# Patient Record
Sex: Female | Born: 1995 | Race: Asian | Hispanic: No | Marital: Single | Smoking: Former smoker
Health system: Southern US, Community
[De-identification: ages and names within clinical notes are randomized; demographics above are authoritative.]

## PROBLEM LIST (undated history)

## (undated) ENCOUNTER — Inpatient Hospital Stay (HOSPITAL_COMMUNITY): Payer: Self-pay

## (undated) DIAGNOSIS — Z789 Other specified health status: Secondary | ICD-10-CM

## (undated) DIAGNOSIS — A749 Chlamydial infection, unspecified: Secondary | ICD-10-CM

## (undated) HISTORY — PX: NO PAST SURGERIES: SHX2092

---
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
## 2015-09-02 ENCOUNTER — Encounter (HOSPITAL_COMMUNITY): Payer: Self-pay | Admitting: *Deleted

## 2015-09-02 ENCOUNTER — Emergency Department (HOSPITAL_COMMUNITY)
Admission: EM | Admit: 2015-09-02 | Discharge: 2015-09-03 | Disposition: A | Discharge status: Home / Self Care | Payer: BLUE CROSS/BLUE SHIELD | Attending: Emergency Medicine | Admitting: Emergency Medicine

## 2015-09-02 ENCOUNTER — Emergency Department (HOSPITAL_COMMUNITY): Payer: BLUE CROSS/BLUE SHIELD

## 2015-09-02 DIAGNOSIS — O2391 Unspecified genitourinary tract infection in pregnancy, first trimester: Secondary | ICD-10-CM | POA: Diagnosis not present

## 2015-09-02 DIAGNOSIS — Z79899 Other long term (current) drug therapy: Secondary | ICD-10-CM | POA: Diagnosis not present

## 2015-09-02 DIAGNOSIS — N760 Acute vaginitis: Secondary | ICD-10-CM

## 2015-09-02 DIAGNOSIS — N939 Abnormal uterine and vaginal bleeding, unspecified: Secondary | ICD-10-CM

## 2015-09-02 DIAGNOSIS — B9689 Other specified bacterial agents as the cause of diseases classified elsewhere: Secondary | ICD-10-CM

## 2015-09-02 DIAGNOSIS — O209 Hemorrhage in early pregnancy, unspecified: Secondary | ICD-10-CM | POA: Diagnosis present

## 2015-09-02 DIAGNOSIS — Z3A01 Less than 8 weeks gestation of pregnancy: Secondary | ICD-10-CM | POA: Insufficient documentation

## 2015-09-02 DIAGNOSIS — O2341 Unspecified infection of urinary tract in pregnancy, first trimester: Secondary | ICD-10-CM | POA: Insufficient documentation

## 2015-09-02 LAB — CBC
HEMATOCRIT: 35.5 % — AB (ref 36.0–46.0)
Hemoglobin: 10.5 g/dL — ABNORMAL LOW (ref 12.0–15.0)
MCH: 20.1 pg — AB (ref 26.0–34.0)
MCHC: 29.6 g/dL — AB (ref 30.0–36.0)
MCV: 67.9 fL — AB (ref 78.0–100.0)
Platelets: 347 10*3/uL (ref 150–400)
RBC: 5.23 MIL/uL — ABNORMAL HIGH (ref 3.87–5.11)
RDW: 16.7 % — AB (ref 11.5–15.5)
WBC: 10.6 10*3/uL — AB (ref 4.0–10.5)

## 2015-09-02 LAB — URINALYSIS, ROUTINE W REFLEX MICROSCOPIC
BILIRUBIN URINE: NEGATIVE
GLUCOSE, UA: NEGATIVE mg/dL
Ketones, ur: NEGATIVE mg/dL
Nitrite: NEGATIVE
PH: 7.5 (ref 5.0–8.0)
Protein, ur: NEGATIVE mg/dL
SPECIFIC GRAVITY, URINE: 1.008 (ref 1.005–1.030)
Urobilinogen, UA: 0.2 mg/dL (ref 0.0–1.0)

## 2015-09-02 LAB — BASIC METABOLIC PANEL
ANION GAP: 7 (ref 5–15)
BUN: 6 mg/dL (ref 6–20)
CALCIUM: 9.2 mg/dL (ref 8.9–10.3)
CO2: 26 mmol/L (ref 22–32)
Chloride: 103 mmol/L (ref 101–111)
Creatinine, Ser: 0.53 mg/dL (ref 0.44–1.00)
GFR calc Af Amer: >60 mL/min (ref >60)
GFR calc non Af Amer: >60 mL/min (ref >60)
GLUCOSE: 100 mg/dL — AB (ref 65–99)
Potassium: 3.6 mmol/L (ref 3.5–5.1)
Sodium: 136 mmol/L (ref 135–145)

## 2015-09-02 LAB — WET PREP, GENITAL
Trich, Wet Prep: NONE SEEN
Yeast Wet Prep HPF POC: NONE SEEN

## 2015-09-02 LAB — URINE MICROSCOPIC-ADD ON

## 2015-09-02 LAB — HCG, QUANTITATIVE, PREGNANCY: HCG, BETA CHAIN, QUANT, S: 10817 m[IU]/mL — AB (ref <5)

## 2015-09-02 LAB — ABO/RH: ABO/RH(D): O POS

## 2015-09-02 LAB — POC URINE PREG, ED: Preg Test, Ur: POSITIVE — AB

## 2015-09-02 MED ORDER — AZITHROMYCIN 250 MG PO TABS
1000.0000 mg | ORAL_TABLET | Freq: Once | ORAL | Status: AC
  Administered 2015-09-02: 1000 mg via ORAL
  Filled 2015-09-02: qty 4

## 2015-09-02 MED ORDER — CEFTRIAXONE SODIUM 250 MG IJ SOLR
250.0000 mg | Freq: Once | INTRAMUSCULAR | Status: AC
  Administered 2015-09-02: 250 mg via INTRAMUSCULAR
  Filled 2015-09-02: qty 250

## 2015-09-02 MED ORDER — LIDOCAINE HCL (PF) 1 % IJ SOLN
2.0000 mL | Freq: Once | INTRAMUSCULAR | Status: AC
  Administered 2015-09-02: 2 mL
  Filled 2015-09-02: qty 5

## 2015-09-02 MED ORDER — METRONIDAZOLE 500 MG PO TABS
500.0000 mg | ORAL_TABLET | Freq: Once | ORAL | Status: AC
  Administered 2015-09-02: 500 mg via ORAL
  Filled 2015-09-02: qty 1

## 2015-09-02 NOTE — ED Notes (Signed)
Pt reports +preg test two weeks ago, having light pink spotting today when she wipes and mild cramping.

## 2015-09-02 NOTE — ED Provider Notes (Signed)
CSN: [id number]     Arrival date & time 09/02/15  1802 History   First MD Initiated Contact with Patient 09/02/15 2142     Chief Complaint  Patient presents with  . Vaginal Bleeding     (Consider location/radiation/quality/duration/timing/severity/associated sxs/prior Treatment) HPI Comments: Patient is an 19 yo F presenting to the ED for one  Episode of vaginal spotting today. She states she has not had any more spotting since earlier today. During triage patient states she was having cramping pain associated with this but denies any pain at any point during my evaluation.  Denies any fevers, chills, nausea, vomiting, abdominal pain , urinary symptoms, vaginal discharge. Patient does not remember when her last menstrual cycle was. No abdominal surgical history. No previous pregnancies. She has not seen an OB/GYN yet for her positive pregnancy test.  Patient is a 19 y.o. female presenting with vaginal bleeding.  Vaginal Bleeding   History reviewed. No pertinent past medical history. History reviewed. No pertinent past surgical history. History reviewed. No pertinent family history. Social History  Substance Use Topics  . Smoking status: Never Smoker   . Smokeless tobacco: None  . Alcohol Use: Yes   OB History    Gravida Para Term Preterm AB TAB SAB Ectopic Multiple Living   1              Review of Systems  Genitourinary: Positive for vaginal bleeding.  All other systems reviewed and are negative.     Allergies  Review of patient's allergies indicates no known allergies.  Home Medications   Prior to Admission medications   Medication Sig Start Date End Date Taking? Authorizing Provider  JUNEL 1/20 1-20 MG-MCG tablet Take 1 tablet by mouth daily. 07/15/15  Yes Historical Provider, MD  metroNIDAZOLE (FLAGYL) 500 MG tablet Take 1 tablet (500 mg total) by mouth 2 (two) times daily. 09/03/15   Princessa Lesmeister, PA-C  nitrofurantoin, macrocrystal-monohydrate, (MACROBID) 100  MG capsule Take 1 capsule (100 mg total) by mouth 2 (two) times daily. 09/03/15   Nanetta Wiegman, PA-C  Prenatal Multivit-Min-Fe-FA (PRE-NATAL FORMULA) TABS Take 1 tablet by mouth daily. 09/03/15   Kainoah Bartosiewicz, PA-C   BP 103/57 mmHg  Pulse 77  Temp(Src) 99 F (37.2 C) (Oral)  Resp 14  Ht 5\' 2"  (1.575 m)  Wt 135 lb (61.236 kg)  BMI 24.69 kg/m2  SpO2 100% Physical Exam  Constitutional: She is oriented to person, place, and time. She appears well-developed and well-nourished. No distress.  HENT:  Head: Normocephalic and atraumatic.  Right Ear: External ear normal.  Left Ear: External ear normal.  Nose: Nose normal.  Mouth/Throat: Oropharynx is clear and moist.  Eyes: Conjunctivae are normal.  Neck: Normal range of motion. Neck supple.  No nuchal rigidity.   Cardiovascular: Normal rate, regular rhythm and normal heart sounds.   Pulmonary/Chest: Effort normal and breath sounds normal.  Abdominal: Soft.  Musculoskeletal: Normal range of motion.  Neurological: She is alert and oriented to person, place, and time.  Skin: Skin is warm and dry. She is not diaphoretic.  Psychiatric: She has a normal mood and affect.  Nursing note and vitals reviewed.    Exam performed by Francee Piccolo L,  exam chaperoned Date: 09/02/2015 Pelvic exam: normal external genitalia without evidence of trauma. VULVA: normal appearing vulva with no masses, tenderness or lesion. VAGINA: normal appearing vagina with normal color and discharge, no lesions. CERVIX: normal appearing cervix without lesions, cervical motion tenderness absent, cervical os closed with  out purulent discharge; vaginal discharge - bloody and yellow, Wet prep and DNA probe for chlamydia and GC obtained.   ADNEXA: normal adnexa in size, nontender and no masses UTERUS: uterus is normal size, shape, consistency and nontender.   ED Course  Procedures (including critical care time) Medications  cefTRIAXone (ROCEPHIN)  injection 250 mg (250 mg Intramuscular Given 09/02/15 2355)  azithromycin (ZITHROMAX) tablet 1,000 mg (1,000 mg Oral Given 09/02/15 2359)  metroNIDAZOLE (FLAGYL) tablet 500 mg (500 mg Oral Given 09/02/15 2359)  lidocaine (PF) (XYLOCAINE) 1 % injection 2 mL (2 mLs Other Given 09/02/15 2355)    Labs Review Labs Reviewed  WET PREP, GENITAL - Abnormal; Notable for the following:    Clue Cells Wet Prep HPF POC FEW (*)    WBC, Wet Prep HPF POC TOO NUMEROUS TO COUNT (*)    All other components within normal limits  URINALYSIS, ROUTINE W REFLEX MICROSCOPIC (NOT AT [facility]) - Abnormal; Notable for the following:    Hgb urine dipstick MODERATE (*)    Leukocytes, UA MODERATE (*)    All other components within normal limits  CBC - Abnormal; Notable for the following:    WBC 10.6 (*)    RBC 5.23 (*)    Hemoglobin 10.5 (*)    HCT 35.5 (*)    MCV 67.9 (*)    MCH 20.1 (*)    MCHC 29.6 (*)    RDW 16.7 (*)    All other components within normal limits  BASIC METABOLIC PANEL - Abnormal; Notable for the following:    Glucose, Bld 100 (*)    All other components within normal limits  HCG, QUANTITATIVE, PREGNANCY - Abnormal; Notable for the following:    hCG, Beta Chain, Quant, S 10817 (*)    All other components within normal limits  POC URINE PREG, ED - Abnormal; Notable for the following:    Preg Test, Ur POSITIVE (*)    All other components within normal limits  URINE CULTURE  URINE MICROSCOPIC-ADD ON  RPR  HIV ANTIBODY (ROUTINE TESTING)  ABO/RH  GC/CHLAMYDIA PROBE AMP ([place]) NOT AT [facility]    Imaging Review US Ob Comp Less 14 Wks  09/02/2015   CLINICAL DATA:  19 year old pregnant female with spotting  EXAM: OBSTETRIC <14 WK [place] AND TRANSVAGINAL OB US  TECHNIQUE: Both transabdominal and transvaginal ultrasound examinations were performed for complete evaluation of the gestation as well as the maternal uterus, adnexal regions, and pelvic cul-de-sac. Transvaginal technique was performed to assess  early pregnancy.  COMPARISON:  None.  FINDINGS: Intrauterine gestational sac: Present  Yolk sac:  Seen  Embryo:  Not visualized  Cardiac Activity: NA  Heart Rate: NA  bpm  MSD: 7  mm   5 w   3  d  There is a small subchorionic hemorrhage.  Maternal uterus/adnexae: There is a 2.8 x 2.2 x 2.1 cm cyst with some or irregular wall in the right ovary. The left ovary is unremarkable.  Small free fluid within the pelvis.  IMPRESSION: Intrauterine gestational sac with an estimated gestational age of [redacted] weeks 3 days. No fetal pole identified at this time. Follow-up recommended.   Electronically Signed   By: Elgie Collard M.D.   On: 09/02/2015 21:48   US Ob Transvaginal  09/02/2015   CLINICAL DATA:  19 year old pregnant female with spotting  EXAM: OBSTETRIC <14 WK [place] AND TRANSVAGINAL OB US  TECHNIQUE: Both transabdominal and transvaginal ultrasound examinations were performed for complete evaluation of the  gestation as well as the maternal uterus, adnexal regions, and pelvic cul-de-sac. Transvaginal technique was performed to assess early pregnancy.  COMPARISON:  None.  FINDINGS: Intrauterine gestational sac: Present  Yolk sac:  Seen  Embryo:  Not visualized  Cardiac Activity: NA  Heart Rate: NA  bpm  MSD: 7  mm   5 w   3  d  There is a small subchorionic hemorrhage.  Maternal uterus/adnexae: There is a 2.8 x 2.2 x 2.1 cm cyst with some or irregular wall in the right ovary. The left ovary is unremarkable.  Small free fluid within the pelvis.  IMPRESSION: Intrauterine gestational sac with an estimated gestational age of [redacted] weeks 3 days. No fetal pole identified at this time. Follow-up recommended.   Electronically Signed   By: Elgie Collard M.D.   On: 09/02/2015 21:48   I have personally reviewed and evaluated these images and lab results as part of my medical decision-making.   EKG Interpretation None      MDM   Final diagnoses:  Vaginal bleeding in pregnancy, first trimester  Bacterial vaginosis  UTI in  pregnancy, first trimester    Filed Vitals:   09/03/15 0034  BP:   Pulse:   Temp: 99 F (37.2 C)  Resp:    Afebrile, NAD, non-toxic appearing, AAOx4.   Patient presenting with an episode of light vaginal bleeding today. Abdomen soft, non-tender, non-distended. Yellow discharge and blood noted in vagina. Cervical os closed. No CMT or adnexal fullness/tenderness to suggest PID. Wet prep with clue cells and moderate WBCs will treat with Rocephin, Azithromycin, and Flagyl. UA with leukocytes, will start on macrobid, culture sent. [place] reviewed. Results reviewed with patient. Discussed the need to follow-up with OB/GYN in 2-3 weeks for repeat ultrasound and hCG draw for further assessment of fetal viability. Return precautions discussed. Patient is agreeable to plan. Patient is stable at time of discharge     Francee Piccolo, PA-C 09/03/15 [id number]  Alvira Monday, MD 09/05/15 1236

## 2015-09-02 NOTE — ED Notes (Signed)
Pelvic Cart at the bedside  

## 2015-09-03 LAB — HIV ANTIBODY (ROUTINE TESTING W REFLEX): HIV SCREEN 4TH GENERATION: NONREACTIVE

## 2015-09-03 LAB — RPR: RPR: NONREACTIVE

## 2015-09-03 LAB — GC/CHLAMYDIA PROBE AMP (~~LOC~~) NOT AT ARMC
Chlamydia: NEGATIVE
Neisseria Gonorrhea: NEGATIVE

## 2015-09-03 MED ORDER — NITROFURANTOIN MONOHYD MACRO 100 MG PO CAPS: 100.0000 mg | ORAL_CAPSULE | Freq: Two times a day (BID) | ORAL | Status: DC

## 2015-09-03 MED ORDER — METRONIDAZOLE 500 MG PO TABS: 500.0000 mg | ORAL_TABLET | Freq: Two times a day (BID) | ORAL | Status: DC

## 2015-09-03 MED ORDER — PRE-NATAL FORMULA PO TABS: 1.0000 | ORAL_TABLET | Freq: Every day | ORAL | Status: DC

## 2015-09-03 NOTE — Discharge Instructions (Signed)
Please follow up with your primary care physician in 1-2 days. If you do not have one please call the [facility] number listed above. Please take your antibiotic until completion. Please follow up with an Ob/Gyn to schedule a follow up appointment for repeat ultrasound and pregnancy test in 2-3 weeks. Please refrain from sexual activity for the next 48 hours. If you receive a call informing you of a positive test result please wait 10 days to allow antibiotics to clear your infection. You must notify all partners within the last six months of any positive test results. Please read all discharge instructions and return precautions.   Vaginal Bleeding During Pregnancy, First Trimester A small amount of bleeding (spotting) from the vagina is relatively common in early pregnancy. It usually stops on its own. Various things may cause bleeding or spotting in early pregnancy. Some bleeding may be related to the pregnancy, and some may not. In most cases, the bleeding is normal and is not a problem. However, bleeding can also be a sign of something serious. Be sure to tell your health care provider about any vaginal bleeding right away. Some possible causes of vaginal bleeding during the first trimester include:  Infection or inflammation of the cervix.  Growths (polyps) on the cervix.  Miscarriage or threatened miscarriage.  Pregnancy tissue has developed outside of the uterus and in a fallopian tube (tubal pregnancy).  Tiny cysts have developed in the uterus instead of pregnancy tissue (molar pregnancy). HOME CARE INSTRUCTIONS  Watch your condition for any changes. The following actions may help to lessen any discomfort you are feeling:  Follow your health care provider's instructions for limiting your activity. If your health care provider orders bed rest, you may need to stay in bed and only get up to use the bathroom. However, your health care provider may allow you to continue  light activity.  If needed, make plans for someone to help with your regular activities and responsibilities while you are on bed rest.  Keep track of the number of pads you use each day, how often you change pads, and how soaked (saturated) they are. Write this down.  Do not use tampons. Do not douche.  Do not have sexual intercourse or orgasms until approved by your health care provider.  If you pass any tissue from your vagina, save the tissue so you can show it to your health care provider.  Only take over-the-counter or prescription medicines as directed by your health care provider.  Do not take aspirin because it can make you bleed.  Keep all follow-up appointments as directed by your health care provider. SEEK MEDICAL CARE IF:  You have any vaginal bleeding during any part of your pregnancy.  You have cramps or labor pains.  You have a fever, not controlled by medicine. SEEK IMMEDIATE MEDICAL CARE IF:   You have severe cramps in your back or belly (abdomen).  You pass large clots or tissue from your vagina.  Your bleeding increases.  You feel light-headed or weak, or you have fainting episodes.  You have chills.  You are leaking fluid or have a gush of fluid from your vagina.  You pass out while having a bowel movement. MAKE SURE YOU:  Understand these instructions.  Will watch your condition.  Will get help right away if you are not doing well or get worse. Document Released: 09/16/2005 Document Revised: 12/12/2013 Document Reviewed: 08/14/2013 [facility] Patient Information 2015 [place], [place]. This information  is not intended to replace advice given to you by your health care provider. Make sure you discuss any questions you have with your health care provider.  Bacterial Vaginosis Bacterial vaginosis is a vaginal infection that occurs when the normal balance of bacteria in the vagina is disrupted. It results from an overgrowth of certain bacteria. This is  the most common vaginal infection in women of childbearing age. Treatment is important to prevent complications, especially in pregnant women, as it can cause a premature delivery. CAUSES  Bacterial vaginosis is caused by an increase in harmful bacteria that are normally present in smaller amounts in the vagina. Several different kinds of bacteria can cause bacterial vaginosis. However, the reason that the condition develops is not fully understood. RISK FACTORS Certain activities or behaviors can put you at an increased risk of developing bacterial vaginosis, including:  Having a new sex partner or multiple sex partners.  Douching.  Using an intrauterine device (IUD) for contraception. Women do not get bacterial vaginosis from toilet seats, bedding, swimming pools, or contact with objects around them. SIGNS AND SYMPTOMS  Some women with bacterial vaginosis have no signs or symptoms. Common symptoms include:  Grey vaginal discharge.  A fishlike odor with discharge, especially after sexual intercourse.  Itching or burning of the vagina and vulva.  Burning or pain with urination. DIAGNOSIS  Your health care provider will take a medical history and examine the vagina for signs of bacterial vaginosis. A sample of vaginal fluid may be taken. Your health care provider will look at this sample under a microscope to check for bacteria and abnormal cells. A vaginal pH test may also be done.  TREATMENT  Bacterial vaginosis may be treated with antibiotic medicines. These may be given in the form of a pill or a vaginal cream. A second round of antibiotics may be prescribed if the condition comes back after treatment.  HOME CARE INSTRUCTIONS   Only take over-the-counter or prescription medicines as directed by your health care provider.  If antibiotic medicine was prescribed, take it as directed. Make sure you finish it even if you start to feel better.  Do not have sex until treatment is  completed.  Tell all sexual partners that you have a vaginal infection. They should see their health care provider and be treated if they have problems, such as a mild rash or itching.  Practice safe sex by using condoms and only having one sex partner. SEEK MEDICAL CARE IF:   Your symptoms are not improving after 3 days of treatment.  You have increased discharge or pain.  You have a fever. MAKE SURE YOU:   Understand these instructions.  Will watch your condition.  Will get help right away if you are not doing well or get worse. FOR MORE INFORMATION  Centers for Disease Control and Prevention, Division of STD Prevention: [employer] American Sexual Health Association (ASHA): www.ashastd.org  Document Released: 12/07/2005 Document Revised: 09/27/2013 Document Reviewed: 07/19/2013 [facility] Patient Information 2015 [place], [place]. This information is not intended to replace advice given to you by your health care provider. Make sure you discuss any questions you have with your health care provider.

## 2015-09-05 LAB — URINE CULTURE

## 2015-09-29 ENCOUNTER — Encounter (HOSPITAL_COMMUNITY): Payer: Self-pay | Admitting: Emergency Medicine

## 2015-09-29 ENCOUNTER — Emergency Department (HOSPITAL_COMMUNITY): Payer: BLUE CROSS/BLUE SHIELD

## 2015-09-29 ENCOUNTER — Emergency Department (HOSPITAL_COMMUNITY)
Admission: EM | Admit: 2015-09-29 | Discharge: 2015-09-29 | Disposition: A | Discharge status: Home / Self Care | Payer: BLUE CROSS/BLUE SHIELD | Attending: Emergency Medicine | Admitting: Emergency Medicine

## 2015-09-29 DIAGNOSIS — N390 Urinary tract infection, site not specified: Secondary | ICD-10-CM

## 2015-09-29 DIAGNOSIS — N9489 Other specified conditions associated with female genital organs and menstrual cycle: Secondary | ICD-10-CM

## 2015-09-29 DIAGNOSIS — O2341 Unspecified infection of urinary tract in pregnancy, first trimester: Secondary | ICD-10-CM | POA: Insufficient documentation

## 2015-09-29 DIAGNOSIS — Z3A09 9 weeks gestation of pregnancy: Secondary | ICD-10-CM | POA: Insufficient documentation

## 2015-09-29 DIAGNOSIS — B373 Candidiasis of vulva and vagina: Secondary | ICD-10-CM | POA: Insufficient documentation

## 2015-09-29 DIAGNOSIS — N9089 Other specified noninflammatory disorders of vulva and perineum: Secondary | ICD-10-CM | POA: Insufficient documentation

## 2015-09-29 DIAGNOSIS — Z79899 Other long term (current) drug therapy: Secondary | ICD-10-CM | POA: Insufficient documentation

## 2015-09-29 DIAGNOSIS — R102 Pelvic and perineal pain: Secondary | ICD-10-CM

## 2015-09-29 DIAGNOSIS — O9989 Other specified diseases and conditions complicating pregnancy, childbirth and the puerperium: Secondary | ICD-10-CM | POA: Diagnosis present

## 2015-09-29 DIAGNOSIS — O98811 Other maternal infectious and parasitic diseases complicating pregnancy, first trimester: Secondary | ICD-10-CM | POA: Insufficient documentation

## 2015-09-29 DIAGNOSIS — B3731 Acute candidiasis of vulva and vagina: Secondary | ICD-10-CM

## 2015-09-29 DIAGNOSIS — R4589 Other symptoms and signs involving emotional state: Secondary | ICD-10-CM

## 2015-09-29 DIAGNOSIS — Z3490 Encounter for supervision of normal pregnancy, unspecified, unspecified trimester: Secondary | ICD-10-CM

## 2015-09-29 LAB — URINE MICROSCOPIC-ADD ON

## 2015-09-29 LAB — CBC WITH DIFFERENTIAL/PLATELET
Basophils Absolute: 0 10*3/uL (ref 0.0–0.1)
Basophils Relative: 0 %
EOS PCT: 0 %
Eosinophils Absolute: 0 10*3/uL (ref 0.0–0.7)
HEMATOCRIT: 33.8 % — AB (ref 36.0–46.0)
Hemoglobin: 10.1 g/dL — ABNORMAL LOW (ref 12.0–15.0)
LYMPHS ABS: 2.3 10*3/uL (ref 0.7–4.0)
Lymphocytes Relative: 18 %
MCH: 20.4 pg — AB (ref 26.0–34.0)
MCHC: 29.9 g/dL — AB (ref 30.0–36.0)
MCV: 68.4 fL — AB (ref 78.0–100.0)
MONO ABS: 1 10*3/uL (ref 0.1–1.0)
MONOS PCT: 8 %
NEUTROS ABS: 9.4 10*3/uL — AB (ref 1.7–7.7)
Neutrophils Relative %: 74 %
PLATELETS: 298 10*3/uL (ref 150–400)
RBC: 4.94 MIL/uL (ref 3.87–5.11)
RDW: 18 % — AB (ref 11.5–15.5)
WBC: 12.7 10*3/uL — AB (ref 4.0–10.5)

## 2015-09-29 LAB — I-STAT BETA HCG BLOOD, ED (MC, WL, AP ONLY): I-stat hCG, quantitative: >2000 m[IU]/mL — ABNORMAL HIGH (ref <5)

## 2015-09-29 LAB — BASIC METABOLIC PANEL
ANION GAP: 10 (ref 5–15)
CALCIUM: 8.8 mg/dL — AB (ref 8.9–10.3)
CO2: 22 mmol/L (ref 22–32)
CREATININE: 0.43 mg/dL — AB (ref 0.44–1.00)
Chloride: 101 mmol/L (ref 101–111)
GFR calc Af Amer: >60 mL/min (ref >60)
GLUCOSE: 84 mg/dL (ref 65–99)
Potassium: 3.2 mmol/L — ABNORMAL LOW (ref 3.5–5.1)
Sodium: 133 mmol/L — ABNORMAL LOW (ref 135–145)

## 2015-09-29 LAB — URINALYSIS, ROUTINE W REFLEX MICROSCOPIC
BILIRUBIN URINE: NEGATIVE
GLUCOSE, UA: NEGATIVE mg/dL
KETONES UR: 15 mg/dL — AB
NITRITE: NEGATIVE
PH: 6 (ref 5.0–8.0)
Protein, ur: 30 mg/dL — AB
Urobilinogen, UA: 0.2 mg/dL (ref 0.0–1.0)

## 2015-09-29 LAB — WET PREP, GENITAL
CLUE CELLS WET PREP: NONE SEEN
Trich, Wet Prep: NONE SEEN
YEAST WET PREP: NONE SEEN

## 2015-09-29 LAB — HCG, QUANTITATIVE, PREGNANCY: hCG, Beta Chain, Quant, S: 83252 m[IU]/mL — ABNORMAL HIGH (ref <5)

## 2015-09-29 MED ORDER — ACETAMINOPHEN 325 MG PO TABS
650.0000 mg | ORAL_TABLET | Freq: Once | ORAL | Status: AC
  Administered 2015-09-29: 650 mg via ORAL
  Filled 2015-09-29: qty 2

## 2015-09-29 MED ORDER — LIDOCAINE VISCOUS 2 % MT SOLN
20.0000 mL | Freq: Once | OROMUCOSAL | Status: AC
  Administered 2015-09-29: 20 mL via OROMUCOSAL
  Filled 2015-09-29: qty 30

## 2015-09-29 MED ORDER — PRENATAL COMPLETE 14-0.4 MG PO TABS: 1.0000 | ORAL_TABLET | Freq: Every day | ORAL | Status: DC

## 2015-09-29 MED ORDER — VALACYCLOVIR HCL 500 MG PO TABS
1000.0000 mg | ORAL_TABLET | Freq: Once | ORAL | Status: AC
  Administered 2015-09-29: 1000 mg via ORAL
  Filled 2015-09-29: qty 2

## 2015-09-29 MED ORDER — CEPHALEXIN 250 MG PO CAPS
500.0000 mg | ORAL_CAPSULE | Freq: Once | ORAL | Status: AC
  Administered 2015-09-29: 500 mg via ORAL
  Filled 2015-09-29: qty 2

## 2015-09-29 MED ORDER — FLUCONAZOLE 100 MG PO TABS
100.0000 mg | ORAL_TABLET | Freq: Every day | ORAL | Status: DC
  Administered 2015-09-29: 100 mg via ORAL
  Filled 2015-09-29: qty 1

## 2015-09-29 MED ORDER — VALACYCLOVIR HCL 1 G PO TABS: 1000.0000 mg | ORAL_TABLET | Freq: Two times a day (BID) | ORAL | Status: DC

## 2015-09-29 MED ORDER — CEPHALEXIN 500 MG PO CAPS: 500.0000 mg | ORAL_CAPSULE | Freq: Four times a day (QID) | ORAL | Status: DC

## 2015-09-29 NOTE — ED Notes (Signed)
Pt reports that she has a yeast infection x 1 month. Pt reports that she was seen her for same and got medication but no relief.

## 2015-09-29 NOTE — ED Provider Notes (Signed)
CSN: [id number]     Arrival date & time 09/29/15  1546 History   First MD Initiated Contact with Patient 09/29/15 1715     Chief Complaint  Patient presents with  . Vaginitis     (Consider location/radiation/quality/duration/timing/severity/associated sxs/prior Treatment) HPI   Blood pressure 117/80, pulse 107, temperature 97.8 F (36.6 C), temperature source Oral, resp. rate 16, height 5\' 2"  (1.575 m), weight 134 lb (60.782 kg), last menstrual period 07/30/2015, SpO2 97 %, unknown if currently breastfeeding.  Hannah Sosa is a 19 y.o. female [redacted] weeks pregnant complaining of swollen vaginal area onset several days ago. Patient states that she "cut herself on her pee hole while she shaving."  Poor historian and slightly evasive, eventually patient admits vaginal discharge in the area since she was seen a month ago. Patient was seen and treated for PID/BV She states that she took her medications fully and to completion but it did not help. States the vaginal swelling and pain started approximately one week ago and that it occurred before she thinks she cut herself shaving. Denies pregnancy, states that she was informed that she was pregnant on last visit 4 weeks ago and cannot explain why she thinks she is no longer pregnant. She has not followed with OB/GYN, has not terminated the pregnancy. States that she does not want to continue with the pregnancy. She denies abdominal pain, vaginal bleeding,  fever, chills. GU symptoms or lesions in sexual partner. On review, of systems she notes a burning with urination.   History reviewed. No pertinent past medical history. History reviewed. No pertinent past surgical history. No family history on file. Social History  Substance Use Topics  . Smoking status: Never Smoker   . Smokeless tobacco: None  . Alcohol Use: Yes   OB History    Gravida Para Term Preterm AB TAB SAB Ectopic Multiple Living   1              Review of Systems  10 systems  reviewed and found to be negative, except as noted in the HPI.   Allergies  Review of patient's allergies indicates no known allergies.  Home Medications   Prior to Admission medications   Medication Sig Start Date End Date Taking? Authorizing Provider  cephALEXin (KEFLEX) 500 MG capsule Take 1 capsule (500 mg total) by mouth 4 (four) times daily. 09/29/15   Caulder Wehner, PA-C  metroNIDAZOLE (FLAGYL) 500 MG tablet Take 1 tablet (500 mg total) by mouth 2 (two) times daily. 09/03/15   Jennifer Piepenbrink, PA-C  nitrofurantoin, macrocrystal-monohydrate, (MACROBID) 100 MG capsule Take 1 capsule (100 mg total) by mouth 2 (two) times daily. 09/03/15   Jennifer Piepenbrink, PA-C  Prenatal Multivit-Min-Fe-FA (PRE-NATAL FORMULA) TABS Take 1 tablet by mouth daily. 09/03/15   Francee Piccolo, PA-C  Prenatal Vit-Fe Fumarate-FA (PRENATAL COMPLETE) 14-0.4 MG TABS Take 1 tablet by mouth daily. 09/29/15   Raul Torrance, PA-C  valACYclovir (VALTREX) 1000 MG tablet Take 1 tablet (1,000 mg total) by mouth 2 (two) times daily. 09/29/15   Cyana Shook, PA-C   BP 113/70 mmHg  Pulse 85  Temp(Src) 98.4 F (36.9 C) (Oral)  Resp 14  Ht 5\' 2"  (1.575 m)  Wt 134 lb (60.782 kg)  BMI 24.50 kg/m2  SpO2 100%  LMP 07/30/2015  Breastfeeding? Unknown Physical Exam  Constitutional: She is oriented to person, place, and time. She appears well-developed and well-nourished. No distress.  HENT:  Head: Normocephalic.  Eyes: Conjunctivae and EOM are normal.  Cardiovascular: Normal rate.   Pulmonary/Chest: Effort normal. No stridor.  Genitourinary:  Pelvic exam is chaperoned by technician: Patient has extremely enlarged and inflamed labia minora. No focal Bartholin's gland abscess.  Patient has thick heterogeneous, cottage cheeselike white yellow discharge with positive cervical motion tenderness.  Musculoskeletal: Normal range of motion.  Neurological: She is alert and oriented to person, place, and time.    Psychiatric: She has a normal mood and affect.  Nursing note and vitals reviewed.       ED Course  Procedures (including critical care time) Labs Review Labs Reviewed  WET PREP, GENITAL - Abnormal; Notable for the following:    WBC, Wet Prep HPF POC TOO NUMEROUS TO COUNT (*)    All other components within normal limits  URINALYSIS, ROUTINE W REFLEX MICROSCOPIC (NOT AT [facility]) - Abnormal; Notable for the following:    APPearance TURBID (*)    Specific Gravity, Urine >1.030 (*)    Hgb urine dipstick MODERATE (*)    Ketones, ur 15 (*)    Protein, ur 30 (*)    Leukocytes, UA MODERATE (*)    All other components within normal limits  URINE MICROSCOPIC-ADD ON - Abnormal; Notable for the following:    Squamous Epithelial / LPF MANY (*)    All other components within normal limits  CBC WITH DIFFERENTIAL/PLATELET - Abnormal; Notable for the following:    WBC 12.7 (*)    Hemoglobin 10.1 (*)    HCT 33.8 (*)    MCV 68.4 (*)    MCH 20.4 (*)    MCHC 29.9 (*)    RDW 18.0 (*)    Neutro Abs 9.4 (*)    All other components within normal limits  BASIC METABOLIC PANEL - Abnormal; Notable for the following:    Sodium 133 (*)    Potassium 3.2 (*)    BUN <5 (*)    Creatinine, Ser 0.43 (*)    Calcium 8.8 (*)    All other components within normal limits  HCG, QUANTITATIVE, PREGNANCY - Abnormal; Notable for the following:    hCG, Beta Chain, Quant, S 29528 (*)    All other components within normal limits  I-STAT BETA HCG BLOOD, ED ([facility], [facility], [facility] ONLY) - Abnormal; Notable for the following:    I-stat hCG, quantitative >2000.0 (*)    All other components within normal limits  URINE CULTURE  GC/CHLAMYDIA PROBE AMP ([place]) NOT AT [facility]    Imaging Review US Ob Comp Less 14 Wks  09/29/2015   CLINICAL DATA:  Pelvic pain during pregnancy. Estimated gestational age by LMP is 12 weeks 2 days. Quantitative beta HCG is 83,252.  EXAM: OBSTETRIC <14 WK ULTRASOUND  TECHNIQUE: Transabdominal  ultrasound was performed for evaluation of the gestation as well as the maternal uterus and adnexal regions.  COMPARISON:  09/02/2015  FINDINGS: Intrauterine gestational sac: A single intrauterine pregnancy is identified.  Yolk sac:  Yolk sac is present.  Embryo:  Fetal pole is present.  Cardiac Activity: Fetal cardiac activity is observed.  Heart Rate: 163 bpm  CRL:   24.4  mm   9 w 1 d                  [place] EDC: 05/02/2016  Maternal uterus/adnexae: Uterus is anteverted. No myometrial mass lesions are identified. There is a small subchorionic hemorrhage. Right ovary is visualized and contains a small cyst measuring 2.8 cm maximally. This is likely functional. Left ovary is not identified. No  abnormal adnexal masses are seen. No free fluid.  IMPRESSION: Single intrauterine pregnancy. Estimated gestational age by crown-rump length is 9 weeks 1 day. Small subchorionic hemorrhage is present.   Electronically Signed   By: Burman Nieves M.D.   On: 09/29/2015 22:20   I have personally reviewed and evaluated these images and lab results as part of my medical decision-making.   EKG Interpretation None      MDM   Final diagnoses:  Denial  Pregnancy  Labial swelling  Vaginal yeast infection  UTI (lower urinary tract infection)    Filed Vitals:   09/29/15 1553 09/29/15 1936  BP: 117/80 113/70  Pulse: 107 85  Temp: 97.8 F (36.6 C) 98.4 F (36.9 C)  TempSrc: Oral Oral  Resp: 16 14  Height:  (1.575 m)   Weight: 134 lb (60.782 kg)   SpO2: 97% 100%    Medications  fluconazole (DIFLUCAN) tablet 100 mg (100 mg Oral Given 09/29/15 2041)  acetaminophen (TYLENOL) tablet 650 mg (650 mg Oral Given 09/29/15 1855)  lidocaine (XYLOCAINE) 2 % viscous mouth solution 20 mL (20 mLs Mouth/Throat Given 09/29/15 1855)  cephALEXin (KEFLEX) capsule 500 mg (500 mg Oral Given 09/29/15 1933)  valACYclovir (VALTREX) tablet 1,000 mg (1,000 mg Oral Given 09/29/15 2042)    Hannah Sosa is a  19 y.o. female [redacted] weeks  pregnant with no prenatal care presenting with significant swelling to the labia minora. Patient also reports dysuria. Pelvic exam with cervical motion tenderness and discharge consistent with yeast infection. Lesions to the mucosa of the labia minora consistent with herpes, will initiate treatment. I think the burning from urination may be from the discomfort however, out of an abundance of caution will treat with Keflex, urine culture pending. Sample is highly contaminated with squamous cells. Urine culture at last visit showed no bacterial predominance. Wet prep shows too numerous to count white blood cells. Patient was treated for PID on last visit 4 weeks ago, states that she was compliant with by mouth treatment however, given this patient's issues I doubt that she is a reliable historian. Like her  OB/GYN consult from Dr. Penne Lash appreciated: She recommends giving acyclovir for presumed genital herpes, think this is likely what is causing the swelling. Also, we have discussed how my clinical exam is consistent with a yeast infection. She states that unfortunately, patient will need Diflucan by mouth which is contraindicated in the first trimester however she thinks that this patient will be able to tolerate intravaginal suppositories. Recommends obtaining basic blood work and pelvic ultrasound, states that this patient does not need a transvaginal ultrasound really transmit transabdominal with just evaluation of fetal heart tones will be sufficient. States that the cervical motion tenderness to be very abnormal in pregnancy to have PID rather that it would be a septic abortion and if the fetus is viable this is highly unlikely.   Reconsulted Dr. Penne Lash after ultrasound shows fetal heart activity and white count of 12.7. She recommends not treating for cervicitis/PID, thinks that this is likely simply from herpes. Recommends counseling this patient to go directly to women's hospital if the pain becomes  too severe or if she cannot urinate.  Advised her to obtain from sex and that her sexual partners will need to be tested and treated.  Discussed at length with patient and she repeated return precautions back to me.  Evaluation does not show pathology that would require ongoing emergent intervention or inpatient treatment. Pt is hemodynamically stable and  mentating appropriately. Discussed findings and plan with patient/guardian, who agrees with care plan. All questions answered. Return precautions discussed and outpatient follow up given.   New Prescriptions   CEPHALEXIN (KEFLEX) 500 MG CAPSULE    Take 1 capsule (500 mg total) by mouth 4 (four) times daily.   PRENATAL VIT-FE FUMARATE-FA (PRENATAL COMPLETE) 14-0.4 MG TABS    Take 1 tablet by mouth daily.   VALACYCLOVIR (VALTREX) 1000 MG TABLET    Take 1 tablet (1,000 mg total) by mouth 2 (two) times daily.      Wynetta Emery, PA-C 09/29/15 1610  Eber Hong, MD 09/30/15 [phone]

## 2015-09-29 NOTE — Discharge Instructions (Signed)
°  Take your antibiotics as directed and to completion. You should never have any leftover antibiotics! Push fluids and stay well hydrated.   Follow with OB/GYN as soon as possible.  If the pain becomes too severe or if you are not able to urinate go directly to women's hospital.  Do NOT take any NSAIDs, such as Aspirin, Motrin, Ibuprofen, Aleve, Naproxen etc. Only take Tylenol for pain. Return to the emergency room  for any severe abdominal pain, increasing vaginal bleeding, passing out or repeated vomiting.   Take acetaminophen (Tylenol) up to 975 mg (this is normally 3 over-the-counter pills) up to 3 times a day. Do not drink alcohol. Make sure your other medications do not contain acetaminophen (Read the labels!)

## 2015-09-30 ENCOUNTER — Ambulatory Visit (INDEPENDENT_AMBULATORY_CARE_PROVIDER_SITE_OTHER): Payer: BLUE CROSS/BLUE SHIELD | Admitting: Obstetrics & Gynecology

## 2015-09-30 ENCOUNTER — Encounter: Payer: Self-pay | Admitting: Obstetrics & Gynecology

## 2015-09-30 VITALS — BP 115/70 | HR 105 | Temp 98.1°F | Ht 62.0 in | Wt 133.0 lb

## 2015-09-30 DIAGNOSIS — N9089 Other specified noninflammatory disorders of vulva and perineum: Secondary | ICD-10-CM | POA: Diagnosis not present

## 2015-09-30 LAB — GC/CHLAMYDIA PROBE AMP (~~LOC~~) NOT AT ARMC
Chlamydia: POSITIVE — AB
Neisseria Gonorrhea: NEGATIVE

## 2015-09-30 MED ORDER — LIDOCAINE HCL 2 % EX GEL: 1.0000 "application " | CUTANEOUS | Status: DC | PRN

## 2015-09-30 NOTE — Addendum Note (Signed)
Addended by: Jaynie Collins A on: 09/30/2015 02:59 PM   Modules accepted: Orders

## 2015-09-30 NOTE — Patient Instructions (Signed)
Return to clinic for any scheduled appointments or for any gynecologic concerns as needed.   

## 2015-09-30 NOTE — Progress Notes (Signed)
CLINIC ENCOUNTER NOTE  History:  19 y.o. G1P0 at [redacted] weeks GA here today for follow up of painful vulvar lesions and swelling evaluated in the ED yesterday; thought to be HSV.  She was given Valtrex for presumptive HSV; but patient wants testing to verify this.  She has not picked up her medication.  History reviewed. No pertinent past medical history.  History reviewed. No pertinent past surgical history.  The following portions of the patient's history were reviewed and updated as appropriate: allergies, current medications, past family history, past medical history, past social history, past surgical history and problem list.   Review of Systems:  Pertinent items noted in HPI and remainder of comprehensive ROS otherwise negative.  Objective:  Physical Exam LMP 07/30/2015 CONSTITUTIONAL: Well-developed, well-nourished female in no acute distress.  HENT:  Normocephalic, atraumatic. External right and left ear normal. Oropharynx is clear and moist EYES: Conjunctivae and EOM are normal. Pupils are equal, round, and reactive to light. No scleral icterus.  NECK: Normal range of motion, supple, no masses SKIN: Skin is warm and dry. No rash noted. Not diaphoretic. No erythema. No pallor. NEUROLGIC: Alert and oriented to person, place, and time. Normal reflexes, muscle tone coordination. No cranial nerve deficit noted. PSYCHIATRIC: Normal mood and affect. Normal behavior. Normal judgment and thought content. CARDIOVASCULAR: Normal heart rate noted RESPIRATORY: Effort and breath sounds normal, no problems with respiration noted ABDOMEN: Soft, no distention noted.   MUSCULOSKELETAL: Normal range of motion. No edema noted. PELVIC: Photo from yesterday is below.  Looks similar today, except with more erythema.  HSV culture obtained.       Labs and Imaging US Ob Comp Less 14 Wks  09/29/2015   CLINICAL DATA:  Pelvic pain during pregnancy. Estimated gestational age by LMP is 12 weeks 2 days.  Quantitative beta HCG is 83,252.  EXAM: OBSTETRIC <14 WK ULTRASOUND  TECHNIQUE: Transabdominal ultrasound was performed for evaluation of the gestation as well as the maternal uterus and adnexal regions.  COMPARISON:  09/02/2015  FINDINGS: Intrauterine gestational sac: A single intrauterine pregnancy is identified.  Yolk sac:  Yolk sac is present.  Embryo:  Fetal pole is present.  Cardiac Activity: Fetal cardiac activity is observed.  Heart Rate: 163 bpm  CRL:   24.4  mm   9 w 1 d                  [place] EDC: 05/02/2016  Maternal uterus/adnexae: Uterus is anteverted. No myometrial mass lesions are identified. There is a small subchorionic hemorrhage. Right ovary is visualized and contains a small cyst measuring 2.8 cm maximally. This is likely functional. Left ovary is not identified. No abnormal adnexal masses are seen. No free fluid.  IMPRESSION: Single intrauterine pregnancy. Estimated gestational age by crown-rump length is 9 weeks 1 day. Small subchorionic hemorrhage is present.   Electronically Signed   By: Burman Nieves M.D.   On: 09/29/2015 22:20   US Ob Comp Less 14 Wks  09/02/2015   CLINICAL DATA:  19 year old pregnant female with spotting  EXAM: OBSTETRIC <14 WK [place] AND TRANSVAGINAL OB US  TECHNIQUE: Both transabdominal and transvaginal ultrasound examinations were performed for complete evaluation of the gestation as well as the maternal uterus, adnexal regions, and pelvic cul-de-sac. Transvaginal technique was performed to assess early pregnancy.  COMPARISON:  None.  FINDINGS: Intrauterine gestational sac: Present  Yolk sac:  Seen  Embryo:  Not visualized  Cardiac Activity: NA  Heart Rate: NA  bpm  MSD: 7  mm   5 w   3  d  There is a small subchorionic hemorrhage.  Maternal uterus/adnexae: There is a 2.8 x 2.2 x 2.1 cm cyst with some or irregular wall in the right ovary. The left ovary is unremarkable.  Small free fluid within the pelvis.  IMPRESSION: Intrauterine gestational sac with an estimated  gestational age of [redacted] weeks 3 days. No fetal pole identified at this time. Follow-up recommended.   Electronically Signed   By: Elgie Collard M.D.   On: 09/02/2015 21:48   US Ob Transvaginal  09/02/2015   CLINICAL DATA:  19 year old pregnant female with spotting  EXAM: OBSTETRIC <14 WK [place] AND TRANSVAGINAL OB US  TECHNIQUE: Both transabdominal and transvaginal ultrasound examinations were performed for complete evaluation of the gestation as well as the maternal uterus, adnexal regions, and pelvic cul-de-sac. Transvaginal technique was performed to assess early pregnancy.  COMPARISON:  None.  FINDINGS: Intrauterine gestational sac: Present  Yolk sac:  Seen  Embryo:  Not visualized  Cardiac Activity: NA  Heart Rate: NA  bpm  MSD: 7  mm   5 w   3  d  There is a small subchorionic hemorrhage.  Maternal uterus/adnexae: There is a 2.8 x 2.2 x 2.1 cm cyst with some or irregular wall in the right ovary. The left ovary is unremarkable.  Small free fluid within the pelvis.  IMPRESSION: Intrauterine gestational sac with an estimated gestational age of [redacted] weeks 3 days. No fetal pole identified at this time. Follow-up recommended.   Electronically Signed   By: Elgie Collard M.D.   On: 09/02/2015 21:48    Assessment & Plan:  1. 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  Patient was told to pick up Valtrex prescription, also told to come to [facility] for worsening symptoms. - Herpes simplex virus culture - HSV(herpes smplx)abs-1+2(IgG+IgM)-bld - RPR - HIV antibody - lidocaine (XYLOCAINE) 2 % jelly; Apply 1 application topically as needed.  Dispense: 30 mL; Refill: 2 Patient plans to follow up [facility] for prenatal care Routine preventative health maintenance measures emphasized. Please refer to After Visit Summary for other counseling recommendations.   Return if symptoms worsen or fail to improve.  Total face-to-face time with patient: 20 minutes. Over 50% of encounter was spent on counseling and coordination of  care.   Jaynie Collins, MD, FACOG Attending Obstetrician & Gynecologist, [facility]

## 2015-10-01 ENCOUNTER — Telehealth (HOSPITAL_BASED_OUTPATIENT_CLINIC_OR_DEPARTMENT_OTHER): Payer: Self-pay | Admitting: Emergency Medicine

## 2015-10-01 LAB — RPR

## 2015-10-01 LAB — URINE CULTURE

## 2015-10-01 LAB — HSV(HERPES SIMPLEX VRS) I + II AB-IGG
HSV 1 Glycoprotein G Ab, IgG: 6.46 IV — ABNORMAL HIGH
HSV 2 Glycoprotein G Ab, IgG: 0.2 IV

## 2015-10-01 LAB — HIV ANTIBODY (ROUTINE TESTING W REFLEX): HIV 1&2 Ab, 4th Generation: NONREACTIVE

## 2015-10-01 NOTE — Telephone Encounter (Signed)
Chart handoff to EDP for treatment plan for chlamydia,HSV pending 

## 2015-10-02 LAB — HSV(HERPES SMPLX)ABS-I+II(IGG+IGM)-BLD
HERPES SIMPLEX VRS I-IGM AB (EIA): 1.06 {index}
HSV 1 GLYCOPROTEIN G AB, IGG: 7.82 IV — AB
HSV 2 GLYCOPROTEIN G AB, IGG: 0.13 IV

## 2015-10-02 LAB — HSV(HERPES SIMPLEX VRS) I + II AB-IGM: Herpes Simplex Vrs I&II-IgM Ab (EIA): 0.81 INDEX

## 2015-10-02 LAB — HERPES SIMPLEX VIRUS CULTURE: Organism ID, Bacteria: NOT DETECTED

## 2015-10-02 NOTE — Progress Notes (Signed)
ED Antimicrobial Stewardship Positive Culture Follow Up   Hannah Sosa is an 19 y.o. female who [facility] with a chief complaint of  Chief Complaint  Patient presents with  . Vaginitis    Recent Results (from the past 720 hour(s))  Urine culture     Status: None   Collection Time: 09/02/15  6:18 PM  Result Value Ref Range Status   Specimen Description URINE, CLEAN CATCH  Final   Special Requests NONE  Final   Culture MULTIPLE SPECIES PRESENT, SUGGEST RECOLLECTION  Final   Report Status 09/05/2015 FINAL  Final  Wet prep, genital     Status: Abnormal   Collection Time: 09/02/15 10:50 PM  Result Value Ref Range Status   Yeast Wet Prep HPF POC NONE SEEN NONE SEEN Final   Trich, Wet Prep NONE SEEN NONE SEEN Final   Clue Cells Wet Prep HPF POC FEW (A) NONE SEEN Final   WBC, Wet Prep HPF POC TOO NUMEROUS TO COUNT (A) NONE SEEN Final  Urine culture     Status: None   Collection Time: 09/29/15  5:43 PM  Result Value Ref Range Status   Specimen Description URINE, RANDOM  Final   Special Requests NONE  Final   Culture   Final    >=100,000 COLONIES/mL DIPHTHEROIDS(CORYNEBACTERIUM SPECIES) Standardized susceptibility testing for this organism is not available.    Report Status 10/01/2015 FINAL  Final  Wet prep, genital     Status: Abnormal   Collection Time: 09/29/15  6:27 PM  Result Value Ref Range Status   Yeast Wet Prep HPF POC NONE SEEN NONE SEEN Final   Trich, Wet Prep NONE SEEN NONE SEEN Final   Clue Cells Wet Prep HPF POC NONE SEEN NONE SEEN Final   WBC, Wet Prep HPF POC TOO NUMEROUS TO COUNT (A) NONE SEEN Final  Herpes simplex virus culture     Status: None (Preliminary result)   Collection Time: 09/30/15  3:55 PM  Result Value Ref Range Status   Preliminary Report Culture has been initiated.  Preliminary    [x]  Treated with Keflex and further treatment not indicated.  ED Provider: Will Marijo Fileansie PA-C  19 y/o F, [redacted] wks pregnant, presents with swollen  vaginal area. Endorses cutting herself shaving and has vaginal discharge. HSV1 possible so genital herpes likely causing symptoms. Was treated with Keflex for UTI. Very contaminated UA grew corynebacterium in culture, which is skin flora and likely contaminant. No further treatment indicated. Pt should follow up with PCP.   Sandi CarneNick Suzette Flagler, PharmD Pharmacy Resident Pager: [phone] 10/02/2015, 8:58 AM Infectious Diseases Pharmacist Phone# [phone]

## 2015-10-03 ENCOUNTER — Telehealth (HOSPITAL_COMMUNITY): Payer: Self-pay

## 2015-10-03 NOTE — Telephone Encounter (Addendum)
Post ED Visit - Positive Culture Follow-up  Culture report reviewed by antimicrobial stewardship pharmacist:  []  Celedonio MiyamotoJeremy Frens, Pharm.D., BCPS []  Georgina PillionElizabeth Martin, Pharm.D., BCPS []  CollinsvilleMinh Pham, [address]Ph[address]D., BCPS, AAHIVP []  Estella HuskMichelle Turner, Pharm.D., BCPS, AAHIVP []  Colgate PalmoliveCristy Reyes, [address]Pharm[address] []  Tennis Mustassie Stewart, Pharm.D. Gary FleetX  Nicholas Gazda, Pharm.D.  Positive Urine Cx, >/= 100,000 colonies -> Diptheroids  Treated with Cephalexin Chart reviewed by Will Dansie PA-C "No treatment"  ALSO  Positive Chlamydia culture  [x]  Patient discharged without antimicrobial prescription and treatment is now indicated  Changes discussed with ED provider: Dr Effie ShyWentz New antibiotic prescription "Azithromycin 1 gram po x 1" DHHS form attached  Contacted patient, date 10/03/2015, time 14:55   [name], [name] Dorn 10/03/2015, 3:01 PM

## 2015-10-04 ENCOUNTER — Telehealth (HOSPITAL_COMMUNITY): Payer: Self-pay

## 2015-10-05 ENCOUNTER — Telehealth (HOSPITAL_BASED_OUTPATIENT_CLINIC_OR_DEPARTMENT_OTHER): Payer: Self-pay | Admitting: Emergency Medicine

## 2015-10-05 NOTE — Telephone Encounter (Signed)
Post ED Visit - Positive Culture Follow-up: Successful Patient Follow-Up  Culture assessed and recommendations reviewed by: []  Celedonio MiyamotoJeremy Frens, Pharm.D., BCPS-AQ ID []  Georgina PillionElizabeth Martin, Pharm.D., BCPS []  RiddleMinh Pham, [address]Pharm[address]D., BCPS, AAHIVP []  Estella HuskMichelle Turner, Pharm.D., BCPS, AAHIVP []  Gurleyristy Reyes, [address]Ph[address] []  Tennis Mustassie Stewart, Pharm.D.  Positive chlamydia culture and +HSV  [x]  Patient discharged without antimicrobial prescription and treatment is now indicated []  Organism is resistant to prescribed ED discharge antimicrobial []  Patient with positive blood cultures  Changes discussed with ED provider: Littie DeedsGentry MD New antibiotic prescription azithromycin 1000mg  po x 1  Called to [facility] [address] Contacted patient, 10/05/15   Berle MullMiller, Kenyah Luba 10/05/2015, 12:28 PM

## 2015-10-15 ENCOUNTER — Emergency Department (HOSPITAL_COMMUNITY): Payer: BLUE CROSS/BLUE SHIELD

## 2015-10-15 ENCOUNTER — Emergency Department (HOSPITAL_COMMUNITY)
Admission: EM | Admit: 2015-10-15 | Discharge: 2015-10-15 | Disposition: A | Discharge status: Home / Self Care | Payer: BLUE CROSS/BLUE SHIELD | Attending: Emergency Medicine | Admitting: Emergency Medicine

## 2015-10-15 DIAGNOSIS — Y92410 Unspecified street and highway as the place of occurrence of the external cause: Secondary | ICD-10-CM | POA: Diagnosis not present

## 2015-10-15 DIAGNOSIS — Z792 Long term (current) use of antibiotics: Secondary | ICD-10-CM | POA: Insufficient documentation

## 2015-10-15 DIAGNOSIS — O9A211 Injury, poisoning and certain other consequences of external causes complicating pregnancy, first trimester: Secondary | ICD-10-CM | POA: Insufficient documentation

## 2015-10-15 DIAGNOSIS — Z3A10 10 weeks gestation of pregnancy: Secondary | ICD-10-CM | POA: Insufficient documentation

## 2015-10-15 DIAGNOSIS — Z79899 Other long term (current) drug therapy: Secondary | ICD-10-CM | POA: Insufficient documentation

## 2015-10-15 DIAGNOSIS — S4992XA Unspecified injury of left shoulder and upper arm, initial encounter: Secondary | ICD-10-CM | POA: Insufficient documentation

## 2015-10-15 DIAGNOSIS — R000 Tachycardia, unspecified: Secondary | ICD-10-CM | POA: Diagnosis not present

## 2015-10-15 DIAGNOSIS — Y9389 Activity, other specified: Secondary | ICD-10-CM | POA: Diagnosis not present

## 2015-10-15 DIAGNOSIS — Y998 Other external cause status: Secondary | ICD-10-CM | POA: Diagnosis not present
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
## 2015-10-15 NOTE — ED Notes (Signed)
Patient undressed, in gown, on continuous pulse oximetry and blood pressure cuff; visitor at bedside 
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
## 2015-10-17 ENCOUNTER — Inpatient Hospital Stay (HOSPITAL_COMMUNITY): Payer: BLUE CROSS/BLUE SHIELD

## 2015-10-17 ENCOUNTER — Encounter (HOSPITAL_COMMUNITY): Payer: Self-pay | Admitting: *Deleted

## 2015-10-17 ENCOUNTER — Inpatient Hospital Stay (HOSPITAL_COMMUNITY)
Admission: AD | Admit: 2015-10-17 | Discharge: 2015-10-17 | Disposition: A | Discharge status: Home / Self Care | Payer: BLUE CROSS/BLUE SHIELD | Source: Ambulatory Visit | Attending: Obstetrics & Gynecology | Admitting: Obstetrics & Gynecology

## 2015-10-17 DIAGNOSIS — Z3491 Encounter for supervision of normal pregnancy, unspecified, first trimester: Secondary | ICD-10-CM

## 2015-10-17 DIAGNOSIS — O98311 Other infections with a predominantly sexual mode of transmission complicating pregnancy, first trimester: Secondary | ICD-10-CM | POA: Insufficient documentation

## 2015-10-17 DIAGNOSIS — O208 Other hemorrhage in early pregnancy: Secondary | ICD-10-CM | POA: Insufficient documentation

## 2015-10-17 DIAGNOSIS — A568 Sexually transmitted chlamydial infection of other sites: Secondary | ICD-10-CM | POA: Insufficient documentation

## 2015-10-17 DIAGNOSIS — Z6790 Unspecified blood type, Rh positive: Secondary | ICD-10-CM

## 2015-10-17 DIAGNOSIS — O468X1 Other antepartum hemorrhage, first trimester: Secondary | ICD-10-CM

## 2015-10-17 DIAGNOSIS — Z3A11 11 weeks gestation of pregnancy: Secondary | ICD-10-CM | POA: Insufficient documentation

## 2015-10-17 DIAGNOSIS — O209 Hemorrhage in early pregnancy, unspecified: Secondary | ICD-10-CM

## 2015-10-17 DIAGNOSIS — Z6791 Unspecified blood type, Rh negative: Secondary | ICD-10-CM | POA: Insufficient documentation

## 2015-10-17 DIAGNOSIS — O418X10 Other specified disorders of amniotic fluid and membranes, first trimester, not applicable or unspecified: Secondary | ICD-10-CM

## 2015-10-17 DIAGNOSIS — O26891 Other specified pregnancy related conditions, first trimester: Secondary | ICD-10-CM | POA: Insufficient documentation

## 2015-10-17 DIAGNOSIS — O98819 Other maternal infectious and parasitic diseases complicating pregnancy, unspecified trimester: Secondary | ICD-10-CM

## 2015-10-17 DIAGNOSIS — A749 Chlamydial infection, unspecified: Secondary | ICD-10-CM

## 2015-10-17 HISTORY — DX: Other specified health status: Z78.9

## 2015-10-17 LAB — CBC
HCT: 33.8 % — ABNORMAL LOW (ref 36.0–46.0)
Hemoglobin: 10.6 g/dL — ABNORMAL LOW (ref 12.0–15.0)
MCH: 21.2 pg — AB (ref 26.0–34.0)
MCHC: 31.4 g/dL (ref 30.0–36.0)
MCV: 67.7 fL — AB (ref 78.0–100.0)
PLATELETS: 318 10*3/uL (ref 150–400)
RBC: 4.99 MIL/uL (ref 3.87–5.11)
RDW: 18.3 % — ABNORMAL HIGH (ref 11.5–15.5)
WBC: 11.7 10*3/uL — ABNORMAL HIGH (ref 4.0–10.5)
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
## 2015-10-17 NOTE — MAU Note (Signed)
Urine sent to lab 
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
## 2015-10-17 NOTE — Discharge Instructions (Signed)
Prenatal Care °WHAT IS PRENATAL CARE?  °Prenatal care is the process of caring for a pregnant woman before she gives birth. Prenatal care makes sure that she and her baby remain as healthy as possible throughout pregnancy. Prenatal care may be provided by a midwife, family practice health care provider, or a childbirth and pregnancy specialist (obstetrician). Prenatal care may include physical examinations, testing, treatments, and education on nutrition, lifestyle, and social support services. °WHY IS PRENATAL CARE SO IMPORTANT?  °Early and consistent prenatal care increases the chance that you and your baby will remain healthy throughout your pregnancy. This type of care also decreases a baby's risk of being born too early (prematurely), or being born smaller than expected (small for gestational age). Any underlying medical conditions you may have that could pose a risk during your pregnancy are discussed during prenatal care visits. You will also be monitored regularly for any new conditions that may arise during your pregnancy so they can be treated quickly and effectively. °WHAT HAPPENS DURING PRENATAL CARE VISITS? °Prenatal care visits may include the following: °Discussion °Tell your health care provider about any new signs or symptoms you have experienced since your last visit. These might include: °· Nausea or vomiting. °· Increased or decreased level of energy. °· Difficulty sleeping. °· Back or leg pain. °· Weight changes. °· Frequent urination. °· Shortness of breath with physical activity. °· Changes in your skin, such as the development of a rash or itchiness. °· Vaginal discharge or bleeding. °· Feelings of excitement or nervousness. °· Changes in your baby's movements. °You may want to write down any questions or topics you want to discuss with your health care provider and bring them with you to your appointment. °Examination °During your first prenatal care visit, you will likely have a complete  physical exam. Your health care provider will often examine your vagina, cervix, and the position of your uterus, as well as check your heart, lungs, and other body systems. As your pregnancy progresses, your health care provider will measure the size of your uterus and your baby's position inside your uterus. He or she may also examine you for early signs of labor. Your prenatal visits may also include checking your blood pressure and, after about 10-12 weeks of pregnancy, listening to your baby's heartbeat. °Testing °Regular testing often includes: °· Urinalysis. This checks your urine for glucose, protein, or signs of infection. °· Blood count. This checks the levels of white and red blood cells in your body. °· Tests for sexually transmitted infections (STIs). Testing for STIs at the beginning of pregnancy is routinely done and is required in many states. °· Antibody testing. You will be checked to see if you are immune to certain illnesses, such as rubella, that can affect a developing fetus. °· Glucose screen. Around 24-28 weeks of pregnancy, your blood glucose level will be checked for signs of gestational diabetes. Follow-up tests may be recommended. °· Group B strep. This is a bacteria that is commonly found inside a woman's vagina. This test will inform your health care provider if you need an antibiotic to reduce the amount of this bacteria in your body prior to labor and childbirth. °· Ultrasound. Many pregnant women undergo an ultrasound screening around 18-20 weeks of pregnancy to evaluate the health of the fetus and check for any developmental abnormalities. °· HIV (human immunodeficiency virus) testing. Early in your pregnancy, you will be screened for HIV. If you are at high risk for HIV, this test   may be repeated during your third trimester of pregnancy. °You may be offered other testing based on your age, personal or family medical history, or other factors.  °HOW OFTEN SHOULD I PLAN TO SEE MY  HEALTH CARE PROVIDER FOR PRENATAL CARE? °Your prenatal care check-up schedule depends on any medical conditions you have before, or develop during, your pregnancy. If you do not have any underlying medical conditions, you will likely be seen for checkups: °· Monthly, during the first 6 months of pregnancy. °· Twice a month during months 7 and 8 of pregnancy. °· Weekly starting in the 9th month of pregnancy and until delivery. °If you develop signs of early labor or other concerning signs or symptoms, you may need to see your health care provider more often. Ask your health care provider what prenatal care schedule is best for you. °WHAT CAN I DO TO KEEP MYSELF AND MY BABY AS HEALTHY AS POSSIBLE DURING MY PREGNANCY? °· Take a prenatal vitamin containing 400 micrograms (0.4 mg) of folic acid every day. Your health care provider may also ask you to take additional vitamins such as iodine, vitamin D, iron, copper, and zinc. °· Take 1500-2000 mg of calcium daily starting at your 20th week of pregnancy until you deliver your baby. °· Make sure you are up to date on your vaccinations. Unless directed otherwise by your health care provider: °¨ You should receive a tetanus, diphtheria, and pertussis (Tdap) vaccination between the 27th and 36th week of your pregnancy, regardless of when your last Tdap immunization occurred. This helps protect your baby from whooping cough (pertussis) after he or she is born. °¨ You should receive an annual inactivated influenza vaccine (IIV) to help protect you and your baby from influenza. This can be done at any point during your pregnancy. °· Eat a well-rounded diet that includes: °¨ Fresh fruits and vegetables. °¨ Lean proteins. °¨ Calcium-rich foods such as milk, yogurt, hard cheeses, and dark, leafy greens. °¨ Whole grain breads. °· Do not eat seafood high in mercury, including: °¨ Swordfish. °¨ Tilefish. °¨ Shark. °¨ King mackerel. °¨ More than 6 oz tuna per week. °· Do not eat: °¨ Raw  or undercooked meats or eggs. °¨ Unpasteurized foods, such as soft cheeses (brie, blue, or feta), juices, and milks. °¨ Lunch meats. °¨ Hot dogs that have not been heated until they are steaming. °· Drink enough water to keep your urine clear or pale yellow. For many women, this may be 10 or more 8 oz glasses of water each day. Keeping yourself hydrated helps deliver nutrients to your baby and may prevent the start of pre-term uterine contractions. °· Do not use any tobacco products including cigarettes, chewing tobacco, or electronic cigarettes. If you need help quitting, ask your health care provider. °· Do not drink beverages containing alcohol. No safe level of alcohol consumption during pregnancy has been determined. °· Do not use any illegal drugs. These can harm your developing baby or cause a miscarriage. °· Ask your health care provider or pharmacist before taking any prescription or over-the-counter medicines, herbs, or supplements. °· Limit your caffeine intake to no more than 200 mg per day. °· Exercise. Unless told otherwise by your health care provider, try to get 30 minutes of moderate exercise most days of the week. Do not  do high-impact activities, contact sports, or activities with a high risk of falling, such as horseback riding or downhill skiing. °· Get plenty of rest. °· Avoid anything that raises your   body temperature, such as hot tubs and saunas.  If you own a cat, do not empty its litter box. Bacteria contained in cat feces can cause an infection called toxoplasmosis. This can result in serious harm to the fetus.  Stay away from chemicals such as insecticides, lead, mercury, and cleaning or paint products that contain solvents.  Do not have any X-rays taken unless medically necessary.  Take a childbirth and breastfeeding preparation class. Ask your health care provider if you need a referral or recommendation.   This information is not intended to replace advice given to you by  your health care provider. Make sure you discuss any questions you have with your health care provider.   Document Released: 12/10/2003 Document Revised: 12/28/2014 Document Reviewed: 02/21/2014 Elsevier Interactive Patient Education 2016 [employer] Subchorionic Hematoma A subchorionic hematoma is a gathering of blood between the outer wall of the placenta and the inner wall of the womb (uterus). The placenta is the organ that connects the fetus to the wall of the uterus. The placenta performs the feeding, breathing (oxygen to the fetus), and waste removal (excretory work) of the fetus.  Subchorionic hematoma is the most common abnormality found on a result from ultrasonography done during the first trimester or early second trimester of pregnancy. If there has been little or no vaginal bleeding, early small hematomas usually shrink on their own and do not affect your baby or pregnancy. The blood is gradually absorbed over 1-2 weeks. When bleeding starts later in pregnancy or the hematoma is larger or occurs in an older pregnant woman, the outcome may not be as good. Larger hematomas may get bigger, which increases the chances for miscarriage. Subchorionic hematoma also increases the risk of premature detachment of the placenta from the uterus, preterm (premature) labor, and stillbirth. HOME CARE INSTRUCTIONS  Stay on bed rest if your health care provider recommends this. Although bed rest will not prevent more bleeding or prevent a miscarriage, your health care provider may recommend bed rest until you are advised otherwise.  Avoid heavy lifting (more than 10 lb [4.5 kg]), exercise, sexual intercourse, or douching as directed by your health care provider.  Keep track of the number of pads you use each day and how soaked (saturated) they are. Write down this information.  Do not use tampons.  Keep all follow-up appointments as directed by your health care provider. Your health care provider may  ask you to have follow-up blood tests or ultrasound tests or both. SEEK IMMEDIATE MEDICAL CARE IF:  You have severe cramps in your stomach, back, abdomen, or pelvis.  You have a fever.  You pass large clots or tissue. Save any tissue for your health care provider to look at.  Your bleeding increases or you become lightheaded, feel weak, or have fainting episodes.   This information is not intended to replace advice given to you by your health care provider. Make sure you discuss any questions you have with your health care provider.   Document Released: 03/24/2007 Document Revised: 12/28/2014 Document Reviewed: 07/06/2013 Elsevier Interactive Patient Education [employer]

## 2015-11-06 ENCOUNTER — Encounter: Payer: BLUE CROSS/BLUE SHIELD | Admitting: Obstetrics & Gynecology

## 2015-11-10 ENCOUNTER — Encounter (HOSPITAL_COMMUNITY): Payer: Self-pay | Admitting: *Deleted

## 2015-11-10 ENCOUNTER — Inpatient Hospital Stay (HOSPITAL_COMMUNITY)
Admission: AD | Admit: 2015-11-10 | Discharge: 2015-11-10 | Disposition: A | Discharge status: Home / Self Care | Payer: BLUE CROSS/BLUE SHIELD | Source: Ambulatory Visit | Attending: Obstetrics & Gynecology | Admitting: Obstetrics & Gynecology

## 2015-11-10 DIAGNOSIS — O210 Mild hyperemesis gravidarum: Secondary | ICD-10-CM | POA: Diagnosis not present

## 2015-11-10 DIAGNOSIS — O9989 Other specified diseases and conditions complicating pregnancy, childbirth and the puerperium: Secondary | ICD-10-CM

## 2015-11-10 DIAGNOSIS — Z3A15 15 weeks gestation of pregnancy: Secondary | ICD-10-CM | POA: Insufficient documentation

## 2015-11-10 DIAGNOSIS — O26892 Other specified pregnancy related conditions, second trimester: Secondary | ICD-10-CM | POA: Insufficient documentation

## 2015-11-10 DIAGNOSIS — R1030 Lower abdominal pain, unspecified: Secondary | ICD-10-CM | POA: Diagnosis present

## 2015-11-10 DIAGNOSIS — R109 Unspecified abdominal pain: Secondary | ICD-10-CM | POA: Diagnosis not present

## 2015-11-10 DIAGNOSIS — K5900 Constipation, unspecified: Secondary | ICD-10-CM | POA: Diagnosis not present

## 2015-11-10 DIAGNOSIS — O26899 Other specified pregnancy related conditions, unspecified trimester: Secondary | ICD-10-CM

## 2015-11-10 LAB — URINALYSIS, ROUTINE W REFLEX MICROSCOPIC
BILIRUBIN URINE: NEGATIVE
GLUCOSE, UA: NEGATIVE mg/dL
Hgb urine dipstick: NEGATIVE
KETONES UR: NEGATIVE mg/dL
Nitrite: NEGATIVE
PROTEIN: NEGATIVE mg/dL
Specific Gravity, Urine: 1.02 (ref 1.005–1.030)
pH: 8 (ref 5.0–8.0)

## 2015-11-10 LAB — URINE MICROSCOPIC-ADD ON

## 2015-11-10 MED ORDER — MECLIZINE HCL 25 MG PO TABS: 25.0000 mg | ORAL_TABLET | Freq: Three times a day (TID) | ORAL | Status: DC | PRN

## 2015-11-10 MED ORDER — DOCUSATE SODIUM 100 MG PO CAPS: 100.0000 mg | ORAL_CAPSULE | Freq: Two times a day (BID) | ORAL | Status: DC

## 2015-11-10 NOTE — Discharge Instructions (Signed)
Safe Medications in Pregnancy  ° °Acne: °Benzoyl Peroxide °Salicylic Acid ° °Backache/Headache: °Tylenol: 2 regular strength every 4 hours OR °             2 Extra strength every 6 hours ° °Colds/Coughs/Allergies: °Benadryl (alcohol free) 25 mg every 6 hours as needed °Breath right strips °Claritin °Cepacol throat lozenges °Chloraseptic throat spray °Cold-Eeze- up to three times per day °Cough drops, alcohol free °Flonase (by prescription only) °Guaifenesin °Mucinex °Robitussin DM (plain only, alcohol free) °Saline nasal spray/drops °Sudafed (pseudoephedrine) & Actifed ** use only after [redacted] weeks gestation and if you do not have high blood pressure °Tylenol °Vicks Vaporub °Zinc lozenges °Zyrtec  ° °Constipation: °Colace °Ducolax suppositories °Fleet enema °Glycerin suppositories °Metamucil °Milk of magnesia °Miralax °Senokot °Smooth move tea ° °Diarrhea: °Kaopectate °Imodium A-D ° °*NO pepto Bismol ° °Hemorrhoids: °Anusol °Anusol HC °Preparation H °Tucks ° °Indigestion: °Tums °Maalox °Mylanta °Zantac  °Pepcid ° °Insomnia: °Benadryl (alcohol free) 25mg every 6 hours as needed °Tylenol PM °Unisom, no Gelcaps ° °Leg Cramps: °Tums °MagGel ° °Nausea/Vomiting:  °Bonine °Dramamine °Emetrol °Ginger extract °Sea bands °Meclizine  °Nausea medication to take during pregnancy:  °Unisom (doxylamine succinate 25 mg tablets) Take one tablet daily at bedtime. If symptoms are not adequately controlled, the dose can be increased to a maximum recommended dose of two tablets daily (1/2 tablet in the morning, 1/2 tablet mid-afternoon and one at bedtime). °Vitamin B6 100mg tablets. Take one tablet twice a day (up to 200 mg per day). ° °Skin Rashes: °Aveeno products °Benadryl cream or 25mg every 6 hours as needed °Calamine Lotion °1% cortisone cream ° °Yeast infection: °Gyne-lotrimin 7 °Monistat 7 ° ° °**If taking multiple medications, please check labels to avoid duplicating the same active ingredients °**take medication as directed on  the label °** Do not exceed 4000 mg of tylenol in 24 hours °**Do not take medications that contain aspirin or ibuprofen ° °  ________________________________________ ° ° ° ° °To schedule your Maternity Eligibility Appointment, please call [phone].  When you arrive for your appointment you must bring the following items or information listed below.  Your appointment will be rescheduled if you do not have these items or are 15 minutes late. °If currently receiving Medicaid, you MUST bring: °1. Medicaid Card °2. Social Security Card °3. Picture ID °4. Proof of Pregnancy °5. Verification of current address if the address on Medicaid card is incorrect "postmarked mail" °If not receiving Medicaid, you MUST bring: °1. Social Security Card °2. Picture ID °3. Birth Certificate (if available) Passport or *Green Card °4. Proof of Pregnancy °5. Verification of current address "postmarked mail" for each income presented. °6. Verification of insurance coverage, if any °7. Check stubs from each employer for the previous month (if unable to present check stub  °for each week, we will accept check stub for the first and last week ill the same month.) If you can't locate check stubs, you must bring a letter from the employer(s) and it must have the following information on letterhead, typed, in English: °o name of company °o company telephone number °o how long been with the company, if less than one month °o how much person earns per hour °o how many hours per week work °o the gross pay the person earned for the previous month °If you are 19 years old or less, you do not have to bring proof of income unless you work or live with the father of the baby and at that time we   will need proof of income from you and/or the father of the baby. °Green Card recipients are eligible for Medicaid for Pregnant Women (MPW) ° ° ° °

## 2015-11-10 NOTE — MAU Provider Note (Signed)
History     CSN: [id number]  Arrival date and time: 11/10/15 1610   None     Chief Complaint  Patient presents with  . Abdominal Pain  . Nausea   HPI Pt is 19 yo Asian G1P0 at [redacted]w[redacted]d pregnant and presents with lower abdominal pain for about 2 weeks. Pt has been constipated and cannot remember when her last normal bowel movement has been. Pt has seen [facility] on 10/27 for one visit with confirmation of viable IUP. Pt was on [employer].  However, pt's mother says she is not going to pay for this pregnancy and pt needs to find alternate care.  Pt has not applied for Medicaid yet.  Pt has been told different options for care and pt is unsure where to go. Pt has been treated for chlamydia with zithromax 1 gm and also UTI with Keflex and HSV with Valtrex. Pt also c/o of nausea- she is not vomiting at this time. Pt denies abdominal pain at this time. Pt denies spotting or bleeding or UTI sx now.  Pt states HSV is better and only a small amount of vaginal discharge now. Pt states partner was treated for chlamydia RN note:  Expand All Collapse All   Pt C/O lower abd cramping x 2 weeks, feeling very nauseated for the last month, hasn't been vomiting. Pt also states she is constipated, last BM 3 days ago. Denies bleeding.       Past Medical History  Diagnosis Date  . Medical history non-contributory     Past Surgical History  Procedure Laterality Date  . No past surgeries      No family history on file.  Social History  Substance Use Topics  . Smoking status: Never Smoker   . Smokeless tobacco: Not on file  . Alcohol Use: Yes    Allergies: No Known Allergies  Prescriptions prior to admission  Medication Sig Dispense Refill Last Dose  . cephALEXin (KEFLEX) 500 MG capsule Take 1 capsule (500 mg total) by mouth 4 (four) times daily. 20 capsule 0 10/14/2015 at Unknown time  . lidocaine (XYLOCAINE) 2 % jelly Apply 1 application topically as needed. 30 mL 2  10/14/2015 at Unknown time  . Prenatal Vit-Fe Fumarate-FA (PRENATAL COMPLETE) 14-0.4 MG TABS Take 1 tablet by mouth daily. 60 each 1 10/14/2015 at Unknown time  . valACYclovir (VALTREX) 1000 MG tablet Take 1 tablet (1,000 mg total) by mouth 2 (two) times daily. 20 tablet 0 10/14/2015 at Unknown time    Review of Systems  Constitutional: Negative for fever and chills.  Gastrointestinal: Positive for nausea, abdominal pain and constipation. Negative for vomiting and diarrhea.  Genitourinary: Negative for dysuria, urgency and frequency.  Neurological: Negative for dizziness.   Physical Exam   Blood pressure 111/65, pulse 112, temperature 97.7 F (36.5 C), temperature source Oral, resp. rate 18, last menstrual period 08/06/2015.  Physical Exam  Nursing note and vitals reviewed. Constitutional: She is oriented to person, place, and time. She appears well-developed and well-nourished. No distress.  HENT:  Head: Normocephalic.  Eyes: Pupils are equal, round, and reactive to light.  Neck: Normal range of motion.  Cardiovascular: Normal rate.   Respiratory: Effort normal. No respiratory distress.  GI: Soft. She exhibits no distension. There is no tenderness. There is no rebound and no guarding.  Musculoskeletal: Normal range of motion.  Neurological: She is alert and oriented to person, place, and time.  Skin: Skin is warm and dry.  Psychiatric: She has  a normal mood and affect.    MAU Course  Procedures Results for orders placed or performed during the hospital encounter of 11/10/15 (from the past 24 hour(s))  Urinalysis, Routine w reflex microscopic ([facility])     Status: Abnormal   Collection Time: 11/10/15  4:34 PM  Result Value Ref Range   Color, Urine YELLOW YELLOW   APPearance CLEAR CLEAR   Specific Gravity, Urine 1.020 1.005 - 1.030   pH 8.0 5.0 - 8.0   Glucose, UA NEGATIVE NEGATIVE mg/dL   Hgb urine dipstick NEGATIVE NEGATIVE   Bilirubin Urine NEGATIVE NEGATIVE    Ketones, ur NEGATIVE NEGATIVE mg/dL   Protein, ur NEGATIVE NEGATIVE mg/dL   Nitrite NEGATIVE NEGATIVE   Leukocytes, UA SMALL (A) NEGATIVE  Urine microscopic-add on     Status: Abnormal   Collection Time: 11/10/15  4:34 PM  Result Value Ref Range   Squamous Epithelial / LPF 0-5 (A) NONE SEEN   WBC, UA 0-5 0 - 5 WBC/hpf   RBC / HPF 0-5 0 - 5 RBC/hpf   Bacteria, UA FEW (A) NONE SEEN  FHR 154 bpm  Assessment and Plan  Abdominal pain in pregnancy- second trimester viable IUP Nausea- Rx Antivert Constipation- Rx Colace [facility] Medicaid application information given to pt  Zohra Clavel 11/10/2015, 4:30 PM

## 2015-11-10 NOTE — MAU Note (Signed)
Pt C/O lower abd cramping x 2 weeks, feeling very nauseated for the last month, hasn't been vomiting.  Pt also states she is constipated, last BM 3 days ago.  Denies bleeding.

## 2015-12-02 ENCOUNTER — Encounter: Payer: Self-pay | Admitting: Student

## 2015-12-02 ENCOUNTER — Ambulatory Visit (INDEPENDENT_AMBULATORY_CARE_PROVIDER_SITE_OTHER): Payer: BLUE CROSS/BLUE SHIELD | Admitting: Student

## 2015-12-02 VITALS — BP 108/60 | HR 88 | Temp 98.8°F | Wt 134.7 lb

## 2015-12-02 DIAGNOSIS — Z23 Encounter for immunization: Secondary | ICD-10-CM | POA: Diagnosis not present

## 2015-12-02 DIAGNOSIS — Z3482 Encounter for supervision of other normal pregnancy, second trimester: Secondary | ICD-10-CM

## 2015-12-02 DIAGNOSIS — Z3490 Encounter for supervision of normal pregnancy, unspecified, unspecified trimester: Secondary | ICD-10-CM

## 2015-12-02 DIAGNOSIS — Z3492 Encounter for supervision of normal pregnancy, unspecified, second trimester: Secondary | ICD-10-CM

## 2015-12-02 LAB — POCT URINALYSIS DIP (DEVICE)
Bilirubin Urine: NEGATIVE
GLUCOSE, UA: NEGATIVE mg/dL
Hgb urine dipstick: NEGATIVE
Ketones, ur: NEGATIVE mg/dL
NITRITE: NEGATIVE
PH: 8.5 — AB (ref 5.0–8.0)
PROTEIN: NEGATIVE mg/dL
Specific Gravity, Urine: 1.015 (ref 1.005–1.030)
UROBILINOGEN UA: 0.2 mg/dL (ref 0.0–1.0)

## 2015-12-02 NOTE — Patient Instructions (Signed)
Second Trimester of Pregnancy The second trimester is from week 13 through week 28, months 4 through 6. The second trimester is often a time when you feel your best. Your body has also adjusted to being pregnant, and you begin to feel better physically. Usually, morning sickness has lessened or quit completely, you may have more energy, and you may have an increase in appetite. The second trimester is also a time when the fetus is growing rapidly. At the end of the sixth month, the fetus is about 9 inches long and weighs about 1 pounds. You will likely begin to feel the baby move (quickening) between 18 and 20 weeks of the pregnancy. BODY CHANGES Your body goes through many changes during pregnancy. The changes vary from woman to woman.   Your weight will continue to increase. You will notice your lower abdomen bulging out.  You may begin to get stretch marks on your hips, abdomen, and breasts.  You may develop headaches that can be relieved by medicines approved by your health care provider.  You may urinate more often because the fetus is pressing on your bladder.  You may develop or continue to have heartburn as a result of your pregnancy.  You may develop constipation because certain hormones are causing the muscles that push waste through your intestines to slow down.  You may develop hemorrhoids or swollen, bulging veins (varicose veins).  You may have back pain because of the weight gain and pregnancy hormones relaxing your joints between the bones in your pelvis and as a result of a shift in weight and the muscles that support your balance.  Your breasts will continue to grow and be tender.  Your gums may bleed and may be sensitive to brushing and flossing.  Dark spots or blotches (chloasma, mask of pregnancy) may develop on your face. This will likely fade after the baby is born.  A dark line from your belly button to the pubic area (linea nigra) may appear. This will likely fade  after the baby is born.  You may have changes in your hair. These can include thickening of your hair, rapid growth, and changes in texture. Some women also have hair loss during or after pregnancy, or hair that feels dry or thin. Your hair will most likely return to normal after your baby is born. WHAT TO EXPECT AT YOUR PRENATAL VISITS During a routine prenatal visit:  You will be weighed to make sure you and the fetus are growing normally.  Your blood pressure will be taken.  Your abdomen will be measured to track your baby's growth.  The fetal heartbeat will be listened to.  Any test results from the previous visit will be discussed. Your health care provider may ask you:  How you are feeling.  If you are feeling the baby move.  If you have had any abnormal symptoms, such as leaking fluid, bleeding, severe headaches, or abdominal cramping.  If you are using any tobacco products, including cigarettes, chewing tobacco, and electronic cigarettes.  If you have any questions. Other tests that may be performed during your second trimester include:  Blood tests that check for:  Low iron levels (anemia).  Gestational diabetes (between 24 and 28 weeks).  Rh antibodies.  Urine tests to check for infections, diabetes, or protein in the urine.  An ultrasound to confirm the proper growth and development of the baby.  An amniocentesis to check for possible genetic problems.  Fetal screens for spina bifida   and Down syndrome.  HIV (human immunodeficiency virus) testing. Routine prenatal testing includes screening for HIV, unless you choose not to have this test. HOME CARE INSTRUCTIONS   Avoid all smoking, herbs, alcohol, and unprescribed drugs. These chemicals affect the formation and growth of the baby.  Do not use any tobacco products, including cigarettes, chewing tobacco, and electronic cigarettes. If you need help quitting, ask your health care provider. You may receive  counseling support and other resources to help you quit.  Follow your health care provider's instructions regarding medicine use. There are medicines that are either safe or unsafe to take during pregnancy.  Exercise only as directed by your health care provider. Experiencing uterine cramps is a good sign to stop exercising.  Continue to eat regular, healthy meals.  Wear a good support bra for breast tenderness.  Do not use hot tubs, steam rooms, or saunas.  Wear your seat belt at all times when driving.  Avoid raw meat, uncooked cheese, cat litter boxes, and soil used by cats. These carry germs that can cause birth defects in the baby.  Take your prenatal vitamins.  Take 1500-2000 mg of calcium daily starting at the 20th week of pregnancy until you deliver your baby.  Try taking a stool softener (if your health care provider approves) if you develop constipation. Eat more high-fiber foods, such as fresh vegetables or fruit and whole grains. Drink plenty of fluids to keep your urine clear or pale yellow.  Take warm sitz baths to soothe any pain or discomfort caused by hemorrhoids. Use hemorrhoid cream if your health care provider approves.  If you develop varicose veins, wear support hose. Elevate your feet for 15 minutes, 3-4 times a day. Limit salt in your diet.  Avoid heavy lifting, wear low heel shoes, and practice good posture.  Rest with your legs elevated if you have leg cramps or low back pain.  Visit your dentist if you have not gone yet during your pregnancy. Use a soft toothbrush to brush your teeth and be gentle when you floss.  A sexual relationship may be continued unless your health care provider directs you otherwise.  Continue to go to all your prenatal visits as directed by your health care provider. SEEK MEDICAL CARE IF:   You have dizziness.  You have mild pelvic cramps, pelvic pressure, or nagging pain in the abdominal area.  You have persistent nausea,  vomiting, or diarrhea.  You have a bad smelling vaginal discharge.  You have pain with urination. SEEK IMMEDIATE MEDICAL CARE IF:   You have a fever.  You are leaking fluid from your vagina.  You have spotting or bleeding from your vagina.  You have severe abdominal cramping or pain.  You have rapid weight gain or loss.  You have shortness of breath with chest pain.  You notice sudden or extreme swelling of your face, hands, ankles, feet, or legs.  You have not felt your baby move in over an hour.  You have severe headaches that do not go away with medicine.  You have vision changes.   This information is not intended to replace advice given to you by your health care provider. Make sure you discuss any questions you have with your health care provider.   Document Released: 12/01/2001 Document Revised: 12/28/2014 Document Reviewed: 02/07/2013 Elsevier Interactive Patient Education 2016 [employer]    Safe Medications in Pregnancy   Acne: Benzoyl Peroxide Salicylic Acid  Backache/Headache: Tylenol: 2 regular strength every 4   hours OR              2 Extra strength every 6 hours  Colds/Coughs/Allergies: Benadryl (alcohol free) 25 mg every 6 hours as needed Breath right strips Claritin Cepacol throat lozenges Chloraseptic throat spray Cold-Eeze- up to three times per day Cough drops, alcohol free Flonase (by prescription only) Guaifenesin Mucinex Robitussin DM (plain only, alcohol free) Saline nasal spray/drops Sudafed (pseudoephedrine) & Actifed ** use only after [redacted] weeks gestation and if you do not have high blood pressure Tylenol Vicks Vaporub Zinc lozenges Zyrtec   Constipation: Colace Ducolax suppositories Fleet enema Glycerin suppositories Metamucil Milk of magnesia Miralax Senokot Smooth move tea  Diarrhea: Kaopectate Imodium A-D  *NO pepto Bismol  Hemorrhoids: Anusol Anusol HC Preparation  H Tucks  Indigestion: Tums Maalox Mylanta Zantac  Pepcid  Insomnia: Benadryl (alcohol free) 25mg every 6 hours as needed Tylenol PM Unisom, no Gelcaps  Leg Cramps: Tums MagGel  Nausea/Vomiting:  Bonine Dramamine Emetrol Ginger extract Sea bands Meclizine  Nausea medication to take during pregnancy:  Unisom (doxylamine succinate 25 mg tablets) Take one tablet daily at bedtime. If symptoms are not adequately controlled, the dose can be increased to a maximum recommended dose of two tablets daily (1/2 tablet in the morning, 1/2 tablet mid-afternoon and one at bedtime). Vitamin B6 100mg tablets. Take one tablet twice a day (up to 200 mg per day).  Skin Rashes: Aveeno products Benadryl cream or 25mg every 6 hours as needed Calamine Lotion 1% cortisone cream  Yeast infection: Gyne-lotrimin 7 Monistat 7   **If taking multiple medications, please check labels to avoid duplicating the same active ingredients **take medication as directed on the label ** Do not exceed 4000 mg of tylenol in 24 hours **Do not take medications that contain aspirin or ibuprofen     

## 2015-12-02 NOTE — Progress Notes (Signed)
   Subjective:    Hannah Sosa is a G1P0 6258w2d being seen today for her first obstetrical visit.  Her obstetrical history is significant for late to care. Patient does intend to breast feed. Pregnancy history fully reviewed.  Patient reports no complaints.  Filed Vitals:   12/02/15 1335  BP: 108/60  Pulse: 88  Temp: 98.8 F (37.1 C)  Weight: 134 lb 11.2 oz (61.1 kg)    HISTORY: OB History  Gravida Para Term Preterm AB SAB TAB Ectopic Multiple Living  1         0    # Outcome Date GA Lbr Len/2nd Weight Sex Delivery Anes PTL Lv  1 Current              Past Medical History  Diagnosis Date  . Medical history non-contributory    Past Surgical History  Procedure Laterality Date  . No past surgeries     History reviewed. No pertinent family history.   Exam  BP 108/60 mmHg  Pulse 88  Temp(Src) 98.8 F (37.1 C)  Wt 134 lb 11.2 oz (61.1 kg)  LMP 08/06/2015   Uterus:   fundal height at umbilicus  Pelvic Exam:    Skin: normal coloration and turgor, no rashes    Neurologic: oriented, normal, normal mood   Extremities: normal strength, tone, and muscle mass, no deformities   HEENT sclera clear, anicteric   Mouth/Teeth mucous membranes moist, pharynx normal without lesions   Neck supple and no masses   Cardiovascular: regular rate and rhythm   Respiratory:  appears well, vitals normal, no respiratory distress, acyanotic, normal RR, ear and throat exam is normal, neck free of mass or lymphadenopathy, chest clear, no wheezing, crepitations, rhonchi, normal symmetric air entry   Abdomen: soft, non-tender; bowel sounds normal; no masses,  no organomegaly      Assessment:    Pregnancy: G1P0 Patient Active Problem List   Diagnosis Date Noted  . Supervision of normal pregnancy in second trimester 12/02/2015        Plan:  1. Pregnancy  - Prenatal Profile - Hemoglobinopathy evaluation - US MFM OB COMP + 14 WK; Future  2. Supervision of normal pregnancy in second  trimester  - US MFM OB COMP + 14 WK; Future - AFP, Quad Screen - GC/Chlamydia Probe Amp - U/A NO MICRO (81003) - Culture, OB Urine    Initial labs drawn. Prenatal vitamins. Problem list reviewed and updated. Genetic Screening discussed Quad Screen: ordered.  Ultrasound discussed; fetal survey: ordered.  Follow up in 4 weeks.    Judeth Hornrin Ernan Runkles 12/02/2015

## 2015-12-03 LAB — PRENATAL PROFILE (SOLSTAS)
ANTIBODY SCREEN: NEGATIVE
Basophils Absolute: 0 10*3/uL (ref 0.0–0.1)
Basophils Relative: 0 % (ref 0–1)
EOS ABS: 0.1 10*3/uL (ref 0.0–0.7)
Eosinophils Relative: 1 % (ref 0–5)
HCT: 32.5 % — ABNORMAL LOW (ref 36.0–46.0)
HIV: NONREACTIVE
Hemoglobin: 10.2 g/dL — ABNORMAL LOW (ref 12.0–15.0)
Hepatitis B Surface Ag: NEGATIVE
LYMPHS ABS: 1.8 10*3/uL (ref 0.7–4.0)
LYMPHS PCT: 15 % (ref 12–46)
MCH: 21.6 pg — AB (ref 26.0–34.0)
MCHC: 31.4 g/dL (ref 30.0–36.0)
MCV: 68.7 fL — ABNORMAL LOW (ref 78.0–100.0)
MONOS PCT: 6 % (ref 3–12)
MPV: 9.7 fL (ref 8.6–12.4)
Monocytes Absolute: 0.7 10*3/uL (ref 0.1–1.0)
NEUTROS PCT: 78 % — AB (ref 43–77)
Neutro Abs: 9.3 10*3/uL — ABNORMAL HIGH (ref 1.7–7.7)
Platelets: 348 10*3/uL (ref 150–400)
RBC: 4.73 MIL/uL (ref 3.87–5.11)
RDW: 18.8 % — AB (ref 11.5–15.5)
RUBELLA: 16.3 {index} — AB (ref <0.90)
Rh Type: POSITIVE
WBC: 11.9 10*3/uL — ABNORMAL HIGH (ref 4.0–10.5)

## 2015-12-04 LAB — HEMOGLOBINOPATHY EVALUATION
HGB S QUANTITAION: 0 %
Hemoglobin Other: 0 %
Hgb A2 Quant: 2.3 % (ref 2.2–3.2)
Hgb A: 97.7 % (ref 96.8–97.8)
Hgb F Quant: 0 % (ref 0.0–2.0)

## 2015-12-05 ENCOUNTER — Other Ambulatory Visit: Payer: Self-pay | Admitting: Student

## 2015-12-05 ENCOUNTER — Ambulatory Visit (HOSPITAL_COMMUNITY)
Admission: RE | Admit: 2015-12-05 | Discharge: 2015-12-05 | Disposition: A | Discharge status: Home / Self Care | Payer: Medicaid Other | Source: Ambulatory Visit | Attending: Student | Admitting: Student

## 2015-12-05 DIAGNOSIS — O35EXX0 Maternal care for other (suspected) fetal abnormality and damage, fetal genitourinary anomalies, not applicable or unspecified: Secondary | ICD-10-CM

## 2015-12-05 DIAGNOSIS — O358XX0 Maternal care for other (suspected) fetal abnormality and damage, not applicable or unspecified: Secondary | ICD-10-CM

## 2015-12-05 DIAGNOSIS — O0932 Supervision of pregnancy with insufficient antenatal care, second trimester: Secondary | ICD-10-CM | POA: Diagnosis not present

## 2015-12-05 DIAGNOSIS — Z3689 Encounter for other specified antenatal screening: Secondary | ICD-10-CM

## 2015-12-05 DIAGNOSIS — Z3A18 18 weeks gestation of pregnancy: Secondary | ICD-10-CM | POA: Insufficient documentation

## 2015-12-05 DIAGNOSIS — Z36 Encounter for antenatal screening of mother: Secondary | ICD-10-CM | POA: Insufficient documentation

## 2015-12-05 LAB — AFP, QUAD SCREEN
AFP: 88.6 ng/mL
CURR GEST AGE: 18.2 wks.days
HCG TOTAL: 11.71 [IU]/mL
INH: 81.7 pg/mL
Interpretation-AFP: NEGATIVE
MOM FOR HCG: 0.33
MoM for AFP: 1.61
MoM for INH: 0.45
Open Spina bifida: NEGATIVE
Osb Risk: 1:2040 {titer}
Tri 18 Scr Risk Est: NEGATIVE
UE3 MOM: 0.63
UE3 VALUE: 1.07 ng/mL

## 2015-12-05 LAB — CULTURE, OB URINE: Colony Count: 4000

## 2015-12-09 ENCOUNTER — Telehealth: Payer: Self-pay | Admitting: *Deleted

## 2015-12-09 ENCOUNTER — Encounter: Payer: Self-pay | Admitting: Obstetrics and Gynecology

## 2015-12-09 ENCOUNTER — Other Ambulatory Visit: Payer: Self-pay | Admitting: Obstetrics and Gynecology

## 2015-12-09 DIAGNOSIS — R8271 Bacteriuria: Secondary | ICD-10-CM

## 2015-12-09 MED ORDER — PENICILLIN V POTASSIUM 500 MG PO TABS: 500.0000 mg | ORAL_TABLET | Freq: Four times a day (QID) | ORAL | Status: DC

## 2015-12-09 NOTE — Telephone Encounter (Signed)
Per Dr. Jolayne Pantheronstant need to call patient and tell her +GBS UTI and RX prescribed.

## 2015-12-09 NOTE — Telephone Encounter (Signed)
Called Tifanie - unable to leave a message- heard a message voicemail not set up.

## 2015-12-11 NOTE — Telephone Encounter (Signed)
Called patient and informed her of results and prescription at pharmacy. Patient verbalized understanding & had no questions

## 2015-12-24 ENCOUNTER — Telehealth: Payer: Self-pay | Admitting: General Practice

## 2015-12-24 NOTE — Telephone Encounter (Signed)
Patient called into front office stating she is having a sharp pain on her lower right side that is constant. Patient denies taking anything and states she has not finished her medication for UTI. Discussed with patient to finish antibiotic and take as directed until she finishes the medication. Also discussed taking two extra strength tylenol to see if pain improves with that. Patient verbalized understanding & had no other questions

## 2015-12-26 ENCOUNTER — Encounter (HOSPITAL_COMMUNITY): Payer: Self-pay | Admitting: *Deleted

## 2015-12-26 ENCOUNTER — Inpatient Hospital Stay (HOSPITAL_COMMUNITY)
Admission: AD | Admit: 2015-12-26 | Discharge: 2015-12-27 | DRG: 775 | Disposition: A | Discharge status: Home / Self Care | Payer: Medicaid Other | Source: Ambulatory Visit | Attending: Family Medicine | Admitting: Family Medicine

## 2015-12-26 DIAGNOSIS — O42912 Preterm premature rupture of membranes, unspecified as to length of time between rupture and onset of labor, second trimester: Principal | ICD-10-CM | POA: Diagnosis present

## 2015-12-26 DIAGNOSIS — Z3A21 21 weeks gestation of pregnancy: Secondary | ICD-10-CM

## 2015-12-26 DIAGNOSIS — R8271 Bacteriuria: Secondary | ICD-10-CM

## 2015-12-26 DIAGNOSIS — Z3492 Encounter for supervision of normal pregnancy, unspecified, second trimester: Secondary | ICD-10-CM

## 2015-12-26 LAB — URINALYSIS, ROUTINE W REFLEX MICROSCOPIC
Bilirubin Urine: NEGATIVE
GLUCOSE, UA: NEGATIVE mg/dL
KETONES UR: NEGATIVE mg/dL
Nitrite: NEGATIVE
PROTEIN: 100 mg/dL — AB
Specific Gravity, Urine: >1.03 — ABNORMAL HIGH (ref 1.005–1.030)
pH: 6 (ref 5.0–8.0)

## 2015-12-26 LAB — CBC
HEMATOCRIT: 31.5 % — AB (ref 36.0–46.0)
Hemoglobin: 9.7 g/dL — ABNORMAL LOW (ref 12.0–15.0)
MCH: 21.6 pg — AB (ref 26.0–34.0)
MCHC: 30.8 g/dL (ref 30.0–36.0)
MCV: 70 fL — AB (ref 78.0–100.0)
PLATELETS: 325 10*3/uL (ref 150–400)
RBC: 4.5 MIL/uL (ref 3.87–5.11)
RDW: 18.5 % — ABNORMAL HIGH (ref 11.5–15.5)
WBC: 16 10*3/uL — ABNORMAL HIGH (ref 4.0–10.5)

## 2015-12-26 LAB — TYPE AND SCREEN
ABO/RH(D): O POS
Antibody Screen: NEGATIVE

## 2015-12-26 LAB — WET PREP, GENITAL
CLUE CELLS WET PREP: NONE SEEN
Sperm: NONE SEEN
Trich, Wet Prep: NONE SEEN
Yeast Wet Prep HPF POC: NONE SEEN

## 2015-12-26 LAB — URINE MICROSCOPIC-ADD ON

## 2015-12-26 MED ORDER — DIPHENHYDRAMINE HCL 50 MG/ML IJ SOLN: 12.5000 mg | INTRAMUSCULAR | Status: DC | PRN

## 2015-12-26 MED ORDER — LACTATED RINGERS IV SOLN
INTRAVENOUS | Status: DC
  Administered 2015-12-26: 125 mL/h via INTRAVENOUS

## 2015-12-26 MED ORDER — CITRIC ACID-SODIUM CITRATE 334-500 MG/5ML PO SOLN: 30.0000 mL | ORAL | Status: DC | PRN

## 2015-12-26 MED ORDER — OXYCODONE-ACETAMINOPHEN 5-325 MG PO TABS: 2.0000 | ORAL_TABLET | ORAL | Status: DC | PRN

## 2015-12-26 MED ORDER — LACTATED RINGERS IV SOLN: 500.0000 mL | INTRAVENOUS | Status: DC | PRN

## 2015-12-26 MED ORDER — LIDOCAINE HCL (PF) 1 % IJ SOLN
30.0000 mL | INTRAMUSCULAR | Status: DC | PRN
  Filled 2015-12-26: qty 30

## 2015-12-26 MED ORDER — EPHEDRINE 5 MG/ML INJ: 10.0000 mg | INTRAVENOUS | Status: DC | PRN

## 2015-12-26 MED ORDER — PHENYLEPHRINE 40 MCG/ML (10ML) SYRINGE FOR IV PUSH (FOR BLOOD PRESSURE SUPPORT)
80.0000 ug | PREFILLED_SYRINGE | INTRAVENOUS | Status: DC | PRN
  Filled 2015-12-26: qty 20

## 2015-12-26 MED ORDER — OXYTOCIN 10 UNIT/ML IJ SOLN
INTRAMUSCULAR | Status: AC
  Administered 2015-12-26: 20 [IU]
  Filled 2015-12-26: qty 2

## 2015-12-26 MED ORDER — FENTANYL CITRATE (PF) 100 MCG/2ML IJ SOLN
50.0000 ug | INTRAMUSCULAR | Status: DC | PRN
  Administered 2015-12-26 (×2): 50 ug via INTRAVENOUS
  Filled 2015-12-26 (×2): qty 2

## 2015-12-26 MED ORDER — OXYTOCIN 10 UNIT/ML IJ SOLN
20.0000 [IU] | Freq: Once | INTRAMUSCULAR | Status: DC
Start: 2015-12-26 — End: 2015-12-27

## 2015-12-26 MED ORDER — OXYTOCIN BOLUS FROM INFUSION
500.0000 mL | INTRAVENOUS | Status: DC
  Administered 2015-12-26 – 2015-12-27 (×3): 500 mL via INTRAVENOUS

## 2015-12-26 MED ORDER — FENTANYL 2.5 MCG/ML BUPIVACAINE 1/10 % EPIDURAL INFUSION (WH - ANES)
14.0000 mL/h | INTRAMUSCULAR | Status: DC | PRN
Start: 2015-12-26 — End: 2015-12-27
  Filled 2015-12-26: qty 125

## 2015-12-26 MED ORDER — MISOPROSTOL 200 MCG PO TABS
800.0000 ug | ORAL_TABLET | Freq: Once | ORAL | Status: AC
  Administered 2015-12-26: 800 ug via RECTAL

## 2015-12-26 MED ORDER — OXYCODONE-ACETAMINOPHEN 5-325 MG PO TABS: 1.0000 | ORAL_TABLET | ORAL | Status: DC | PRN

## 2015-12-26 MED ORDER — ACETAMINOPHEN 325 MG PO TABS: 650.0000 mg | ORAL_TABLET | ORAL | Status: DC | PRN

## 2015-12-26 MED ORDER — MISOPROSTOL 200 MCG PO TABS
ORAL_TABLET | ORAL | Status: AC
  Administered 2015-12-26: 800 ug via RECTAL
  Filled 2015-12-26: qty 4

## 2015-12-26 MED ORDER — NALBUPHINE HCL 10 MG/ML IJ SOLN: 5.0000 mg | INTRAMUSCULAR | Status: DC | PRN

## 2015-12-26 MED ORDER — OXYTOCIN 10 UNIT/ML IJ SOLN
2.5000 [IU]/h | INTRAVENOUS | Status: DC
  Filled 2015-12-26 (×3): qty 4

## 2015-12-26 MED ORDER — ONDANSETRON HCL 4 MG/2ML IJ SOLN: 4.0000 mg | Freq: Four times a day (QID) | INTRAMUSCULAR | Status: DC | PRN

## 2015-12-26 NOTE — MAU Note (Signed)
PT SAYS  SHE WAS   TRYING  TO HAVE  BM   AT   7PM-  AND  SHE  PUSHED  SO  HARD   THAT  WATER  CAME  OUT-  - UNSURE  IF  URINE.  SOME  IS  STILL  LEAKING    .     SAYS  HAS  ABD  PAIN -      X1 WEEK.   ONLY ON RIGHT   SIDE .  LAST   SEX-   END  OF  DEC.     GETS  [facility]   WITH  CLINIC.

## 2015-12-26 NOTE — MAU Provider Note (Signed)
History     CSN: [id number]  Arrival date and time: 12/26/15 1910   First Provider Initiated Contact with Patient 12/26/15 2003      Chief Complaint  Patient presents with  . Rupture of Membranes   HPI Comments: Hannah Sosa is a 20 y.o. G1P0 at [redacted]w[redacted]d who presents today with leaking of fluid. She states that around 1900 when she was using the bathroom she had a gush of fluid. She also had some spotting when she wiped upon arrival here. She reports intermittent abdominal pain, and has had abdominal pain for "the entire pregnancy".   Vaginal Discharge The patient's primary symptoms include pelvic pain and vaginal discharge. This is a new problem. The current episode started today (around 1900 "a gush of water"). The problem occurs intermittently (q 5 min pain ). Pain severity now: 10/10. Associated symptoms include abdominal pain and constipation (last BM 12/25/14 ). Pertinent negatives include no chills, diarrhea, dysuria, fever, frequency, nausea, urgency or vomiting. The vaginal bleeding is spotting. She has not been passing clots. She has not been passing tissue. Nothing aggravates the symptoms. She has tried nothing for the symptoms. Sexual activity: no intercourse in the past 24 hours      Past Medical History  Diagnosis Date  . Medical history non-contributory     Past Surgical History  Procedure Laterality Date  . No past surgeries      Family History  Problem Relation Age of Onset  . Cancer Neg Hx   . Diabetes Neg Hx   . Hypertension Neg Hx     Social History  Substance Use Topics  . Smoking status: Never Smoker   . Smokeless tobacco: Never Used  . Alcohol Use: No    Allergies: No Known Allergies  Prescriptions prior to admission  Medication Sig Dispense Refill Last Dose  . docusate sodium (COLACE) 100 MG capsule Take 1 capsule (100 mg total) by mouth every 12 (twelve) hours. (Patient not taking: Reported on 12/02/2015) 60 capsule 0 Not Taking  . lidocaine  (XYLOCAINE) 2 % jelly Apply 1 application topically as needed. (Patient not taking: Reported on 12/02/2015) 30 mL 2 Not Taking  . meclizine (ANTIVERT) 25 MG tablet Take 1 tablet (25 mg total) by mouth 3 (three) times daily as needed for nausea. (Patient not taking: Reported on 12/02/2015) 30 tablet 0 Not Taking  . penicillin v potassium (VEETID) 500 MG tablet Take 1 tablet (500 mg total) by mouth 4 (four) times daily. 28 tablet 0   . Prenatal Vit-Fe Fumarate-FA (PRENATAL COMPLETE) 14-0.4 MG TABS Take 1 tablet by mouth daily. 60 each 1 Taking  . valACYclovir (VALTREX) 1000 MG tablet Take 1 tablet (1,000 mg total) by mouth 2 (two) times daily. 20 tablet 0 Taking    Review of Systems  Constitutional: Negative for fever and chills.  Gastrointestinal: Positive for abdominal pain and constipation (last BM 12/25/14 ). Negative for nausea, vomiting and diarrhea.  Genitourinary: Positive for vaginal discharge and pelvic pain. Negative for dysuria, urgency and frequency.   Physical Exam   Blood pressure 106/60, pulse 102, temperature 98 F (36.7 C), temperature source Oral, resp. rate 20, height 5' (1.524 m), weight 63.22 kg (139 lb 6 oz), last menstrual period 08/06/2015.  Physical Exam  Nursing note and vitals reviewed. Constitutional: She is oriented to person, place, and time. She appears well-developed and well-nourished. No distress.  HENT:  Head: Normocephalic.  Cardiovascular: Normal rate.   Respiratory: Effort normal.  GI: Soft. There  is no tenderness. There is no rebound.  Genitourinary:   External: no lesion Vagina: pooling of pink tinged fluid in the vagin Cervix: visually about 4-5 cm with fetal parts visible. Digital exam reveals fetal parts at the os.  Uterus: AGA, FHT 155 with doppler    Neurological: She is alert and oriented to person, place, and time.  Skin: Skin is warm and dry.  Psychiatric: She has a normal mood and affect.    MAU Course  Procedures  MDM 2030: D/W  Drenda FreezeFran and Dr. Shawnie PonsPratt.   Assessment and Plan  PPROM, with cervical dilation Admit to labor and delivery  Hannah Sosa, Alias Hannah Sosa 12/26/2015, 8:04 PM

## 2015-12-27 ENCOUNTER — Encounter (HOSPITAL_COMMUNITY): Payer: Self-pay

## 2015-12-27 DIAGNOSIS — Z3A21 21 weeks gestation of pregnancy: Secondary | ICD-10-CM

## 2015-12-27 DIAGNOSIS — O42912 Preterm premature rupture of membranes, unspecified as to length of time between rupture and onset of labor, second trimester: Secondary | ICD-10-CM

## 2015-12-27 LAB — GC/CHLAMYDIA PROBE AMP (~~LOC~~) NOT AT ARMC
Chlamydia: POSITIVE — AB
NEISSERIA GONORRHEA: NEGATIVE

## 2015-12-27 LAB — ABO/RH: ABO/RH(D): O POS

## 2015-12-27 MED ORDER — IBUPROFEN 600 MG PO TABS: 600.0000 mg | ORAL_TABLET | Freq: Four times a day (QID) | ORAL | Status: DC | PRN

## 2015-12-27 NOTE — Discharge Summary (Signed)
OB Discharge Summary  Patient Name: Hannah Sosa DOB: 09/06/1996 MRN: [id number]  Date of admission: 12/26/2015 Delivering MD: Reva BoresPRATT, TANYA S   Date of discharge: 12/27/2015  Admitting diagnosis: 21w abd pain, possible water broke Intrauterine pregnancy: 6243w5d     Secondary diagnosis:Principal Problem:   Preterm labor in second trimester with preterm delivery  Additional problems:H/o of chlamydia, h/o GBS bacteuria, h/o HSV     Discharge diagnosis: Preterm Pregnancy Delivered                                                                     Post partum procedures:none  Augmentation: None  Complications: None  Hospital course:  Onset of Labor With Vaginal Delivery     20 y.o. yo G1P0101 at 3743w5d was admitted in Active Labor on 12/26/2015. Patient had an uncomplicated labor course as follows:  Membrane Rupture Time/Date: 7:00 PM ,12/26/2015   Intrapartum Procedures: Episiotomy: None [1]                                         Lacerations:  None [1]  Patient had a delivery of a Non Viable infant. 12/26/2015  Information for the patient's newborn:  Zacarias Pontesie, PendingBaby [[id number]][[id number]]       Pateint had an uncomplicated postpartum course.  She is ambulating, tolerating a regular diet, passing flatus, and urinating well. Patient is discharged home in stable condition on 12/27/2015.    Physical exam  Filed Vitals:   12/27/15 0316 12/27/15 0521 12/27/15 0645 12/27/15 0647  BP: 98/55 94/44  109/60  Pulse: 83 97  88  Temp:  98.3 F (36.8 C) 98.4 F (36.9 C)   TempSrc:   Oral   Resp:  14  14  Height:      Weight:       General: alert, cooperative and no distress Lochia: appropriate Uterine Fundus: firm Incision: Healing well with no significant drainage DVT Evaluation: No evidence of DVT seen on physical exam. Labs: Lab Results  Component Value Date   WBC 16.0* 12/26/2015   HGB 9.7* 12/26/2015   HCT 31.5* 12/26/2015   MCV 70.0* 12/26/2015   PLT 325 12/26/2015   CMP  Latest Ref Rng 09/29/2015  Glucose 65 - 99 mg/dL 84  BUN 6 - 20 mg/dL <0(N<5(L)  Creatinine 0.270.44 - 1.00 mg/dL 2.53(G0.43(L)  Sodium 644135 - 034145 mmol/L 133(L)  Potassium 3.5 - 5.1 mmol/L 3.2(L)  Chloride 101 - 111 mmol/L 101  CO2 22 - 32 mmol/L 22  Calcium 8.9 - 10.3 mg/dL 7.4(Q8.8(L)  Total Protein 6.0 - 8.3 g/dL -  Total Bilirubin 0.3 - 1.2 mg/dL -  Alkaline Phos 39 - 595117 U/L -  AST 0 - 37 U/L -  ALT 0 - 35 U/L -    Discharge instruction: per After Visit Summary and "Baby and Me Booklet".  After Visit Meds:    Medication List    TAKE these medications        docusate sodium 100 MG capsule  Commonly known as:  COLACE  Take 1 capsule (100 mg total) by mouth every 12 (twelve) hours.  ibuprofen 600 MG tablet  Commonly known as:  ADVIL,MOTRIN  Take 1 tablet (600 mg total) by mouth every 6 (six) hours as needed.     lidocaine 2 % jelly  Commonly known as:  XYLOCAINE  Apply 1 application topically as needed.     meclizine 25 MG tablet  Commonly known as:  ANTIVERT  Take 1 tablet (25 mg total) by mouth 3 (three) times daily as needed for nausea.     penicillin v potassium 500 MG tablet  Commonly known as:  VEETID  Take 1 tablet (500 mg total) by mouth 4 (four) times daily.     PRENATAL COMPLETE 14-0.4 MG Tabs  Take 1 tablet by mouth daily.     valACYclovir 1000 MG tablet  Commonly known as:  VALTREX  Take 1 tablet (1,000 mg total) by mouth 2 (two) times daily.        Diet: routine diet  Activity: Advance as tolerated. Pelvic rest for 6 weeks.   Outpatient follow up:2 weeks Follow up Appt:Future Appointments Date Time Provider Department Center  01/16/2016 2:00 PM [facility] [place] 1 [facility]   Follow up visit: No Follow-up on file.  Postpartum contraception: None  Newborn Data: Live born unspecified sex  Birth Weight: 13.7 oz (388 g) APGAR: 1, 1   Disposition:morgue   12/27/2015 Reva Bores, MD

## 2015-12-27 NOTE — Progress Notes (Signed)
I spent time with pt and her family to offer grief support as well as emotional and spiritual support.  The family was very focused on logistics and the pt did not seem interested in speaking about her coping and processing in the presence of her family.    Chaplain Dyanne CarrelKaty Araeya Lamb, Bcc Pager, [phone] 2:50 PM    12/27/15 1000  Clinical Encounter Type  Visited With Patient  Visit Type Spiritual support  Referral From Chaplain  Spiritual Encounters  Spiritual Needs Emotional;Grief support  Stress Factors  Patient Stress Factors Loss

## 2015-12-27 NOTE — Progress Notes (Signed)
Chaplain paged at 2151 to provide spiritual and emotional care to Hannah Sosa as she was delivering a child which was not expected to live long.  When chaplain arrived Hannah Sosa was in labor and delivered her very pre-mature child. [facility] request [facility] [employer] [facility]. In concert with nurses the chaplain provided spiritual and emotional comfort to Hannah Sosa and her family. Grief care was also provided to all.   Interventions: Daytime chaplain are requested to follow up with Hannah Sosa early on 27 December 2015 to provide additional support and care.  [facility] to make arrangements for a burial (not cremation). Nurses are aware of this family choice.  [name], DMin, MDiv, MA Chaplain

## 2015-12-27 NOTE — Discharge Instructions (Signed)
Postpartum Care After Vaginal Delivery After you deliver your newborn (postpartum period), the usual stay in the hospital is 24-72 hours. If there were problems with your labor or delivery, or if you have other medical problems, you might be in the hospital longer.  While you are in the hospital, you will receive help and instructions on how to care for yourself during the postpartum period.  While you are in the hospital:  Be sure to tell your nurses if you have pain or discomfort, as well as where you feel the pain and what makes the pain worse.  It is normal to have some bleeding after delivery.  For the first 1-3 days after delivery, the flow is red and the amount may be similar to a period.  It is common for the flow to start and stop.  In the first few days, you may pass some small clots. Let your nurses know if you begin to pass large clots or your flow increases.  Do not  flush blood clots down the toilet before having the nurse look at them.  During the next 3-10 days after delivery, your flow should become more watery and pink or brown-tinged in color.  Ten to fourteen days after delivery, your flow should be a small amount of yellowish-white discharge.  The amount of your flow will decrease over the first few weeks after delivery. Your flow may stop in 6-8 weeks. Most women have had their flow stop by 12 weeks after delivery.  You should change your sanitary pads frequently.  Wash your hands thoroughly with soap and water for at least 20 seconds after changing pads, using the toilet, or before holding or feeding your newborn.  You should feel like you need to empty your bladder within the first 6-8 hours after delivery.  In case you become weak, lightheaded, or faint, call your nurse before you get out of bed for the first time and before you take a shower for the first time.  Within the first few days after delivery, your breasts may begin to feel tender and full. This is  called engorgement. Breast tenderness usually goes away within 48-72 hours after engorgement occurs. You may also notice milk leaking from your breasts. If you are not breastfeeding, do not stimulate your breasts. Breast stimulation can make your breasts produce more milk.  Your hormones change after delivery. Sometimes the hormone changes can temporarily cause you to feel sad or tearful. These feelings should not last more than a few days. If these feelings last longer than that, you should talk to your caregiver.  If desired, talk to your caregiver about methods of family planning or contraception.  Talk to your caregiver about immunizations. Your caregiver may want you to have the following immunizations before leaving the hospital:  Rubella immunization.  Varicella (chickenpox) immunization.  Influenza immunization. You should receive this annual immunization if you did not receive the immunization during your pregnancy.   This information is not intended to replace advice given to you by your health care provider. Make sure you discuss any questions you have with your health care provider.   Document Released: 10/04/2007 Document Revised: 08/31/2012 Document Reviewed: 08/03/2012 Elsevier Interactive Patient Education 2016 [employer]. Postpartum Depression and Baby Blues The postpartum period begins right after the birth of a baby.. It is not unusual to have feelings of shifts in moods, emotions, and thoughts. All mothers are at risk of developing postpartum depression or the "baby blues." These  mood changes can occur right after giving birth, or they may occur many months after giving birth. The baby blues or postpartum depression can be mild or severe. Additionally, postpartum depression can go away rather quickly, or it can be a long-term condition.  CAUSES Raised hormone levels and the rapid drop in those levels are thought to be a main cause of postpartum depression and the baby  blues. A number of hormones change during and after pregnancy. Estrogen and progesterone usually decrease right after the delivery of your baby. The levels of thyroid hormone and various cortisol steroids also rapidly drop. Other factors that play a role in these mood changes include major life events and genetics.  RISK FACTORS If you have any of the following risks for the baby blues or postpartum depression, know what symptoms to watch out for during the postpartum period. Risk factors that may increase the likelihood of getting the baby blues or postpartum depression include:  Having a personal or family history of depression.   Having depression while being pregnant.   Having premenstrual mood issues or mood issues related to oral contraceptives.  Having a lot of life stress.   Having marital conflict.   Lacking a social support network.   Having a baby with special needs.   Having health problems, such as diabetes.  SIGNS AND SYMPTOMS Symptoms of baby blues include:  Brief changes in mood, such as going from extreme happiness to sadness.  Decreased concentration.   Difficulty sleeping.   Crying spells, tearfulness.   Irritability.   Anxiety.  Symptoms of postpartum depression typically begin within the first month after giving birth. These symptoms include:  Difficulty sleeping or excessive sleepiness.   Marked weight loss.   Agitation.   Feelings of worthlessness.   Lack of interest in activity or food.  Postpartum psychosis is a very serious condition and can be dangerous. Fortunately, it is rare. Displaying any of the following symptoms is cause for immediate medical attention. Symptoms of postpartum psychosis include:   Hallucinations and delusions.   Bizarre or disorganized behavior.   Confusion or disorientation.  DIAGNOSIS  A diagnosis is made by an evaluation of your symptoms. There are no medical or lab tests that lead to a  diagnosis, but there are various questionnaires that a health care provider may use to identify those with the baby blues, postpartum depression, or psychosis. Often, a screening tool called the [place] Postnatal Depression Scale is used to diagnose depression in the postpartum period.  TREATMENT The baby blues usually goes away on its own in 1-2 weeks. Social support is often all that is needed. You will be encouraged to get adequate sleep and rest. Occasionally, you may be given medicines to help you sleep.  Postpartum depression requires treatment because it can last several months or longer if it is not treated. Treatment may include individual or group therapy, medicine, or both to address any social, physiological, and psychological factors that may play a role in the depression. Regular exercise, a healthy diet, rest, and social support may also be strongly recommended.  Postpartum psychosis is more serious and needs treatment right away. Hospitalization is often needed. HOME CARE INSTRUCTIONS  Get as much rest as you can. Nap when the baby sleeps.   Exercise regularly. Some women find yoga and walking to be beneficial.   Eat a balanced and nourishing diet.   Do little things that you enjoy. Have a cup of tea, take a bubble bath, read  your favorite magazine, or listen to your favorite music.  Avoid alcohol.   Ask for help with household chores, cooking, grocery shopping, or running errands as needed. Do not try to do everything.   Talk to people close to you about how you are feeling. Get support from your partner, family members, friends, or other new moms.  Try to stay positive in how you think. Think about the things you are grateful for.   Do not spend a lot of time alone.   Only take over-the-counter or prescription medicine as directed by your health care provider.  Keep all your postpartum appointments.   Let your health care provider know if you have any  concerns.  SEEK MEDICAL CARE IF: You are having a reaction to or problems with your medicine. SEEK IMMEDIATE MEDICAL CARE IF:  You have suicidal feelings.   You think you may harm the baby or someone else. MAKE SURE YOU:  Understand these instructions.  Will watch your condition.  Will get help right away if you are not doing well or get worse.   This information is not intended to replace advice given to you by your health care provider. Make sure you discuss any questions you have with your health care provider.   Document Released: 09/10/2004 Document Revised: 12/12/2013 Document Reviewed: 09/18/2013 Elsevier Interactive Patient Education [employer]2016 Elsevier Inc.

## 2015-12-27 NOTE — H&P (Signed)
Chief Complaint  Patient presents with  . Rupture of Membranes   HPI Comments: Hannah MylarZana K Sosa is a 20 y.o. G1P0 at 3740w5d who presents today with leaking of fluid. She states that around 1900 when she was using the bathroom she had a gush of fluid. She also had some spotting when she wiped upon arrival here. She reports intermittent abdominal pain, and has had abdominal pain for "the entire pregnancy".   Vaginal Discharge The patient's primary symptoms include pelvic pain and vaginal discharge. This is a new problem. The current episode started today (around 1900 "a gush of water"). The problem occurs intermittently (q 5 min pain ). Pain severity now: 10/10. Associated symptoms include abdominal pain and constipation (last BM 12/25/14 ). Pertinent negatives include no chills, diarrhea, dysuria, fever, frequency, nausea, urgency or vomiting. The vaginal bleeding is spotting. She has not been passing clots. She has not been passing tissue. Nothing aggravates the symptoms. She has tried nothing for the symptoms. Sexual activity: no intercourse in the past 24 hours     Past Medical History  Diagnosis Date  . Medical history non-contributory     Past Surgical History  Procedure Laterality Date  . No past surgeries      Family History  Problem Relation Age of Onset  . Cancer Neg Hx   . Diabetes Neg Hx   . Hypertension Neg Hx     Social History  Substance Use Topics  . Smoking status: Never Smoker   . Smokeless tobacco: Never Used  . Alcohol Use: No    Allergies: No Known Allergies  Prescriptions prior to admission  Medication Sig Dispense Refill Last Dose  . docusate sodium (COLACE) 100 MG capsule Take 1 capsule (100 mg total) by mouth every 12 (twelve) hours. (Patient not taking: Reported on 12/02/2015) 60 capsule 0 Not Taking  . lidocaine (XYLOCAINE) 2 % jelly Apply 1 application topically as needed. (Patient not  taking: Reported on 12/02/2015) 30 mL 2 Not Taking  . meclizine (ANTIVERT) 25 MG tablet Take 1 tablet (25 mg total) by mouth 3 (three) times daily as needed for nausea. (Patient not taking: Reported on 12/02/2015) 30 tablet 0 Not Taking  . penicillin v potassium (VEETID) 500 MG tablet Take 1 tablet (500 mg total) by mouth 4 (four) times daily. 28 tablet 0   . Prenatal Vit-Fe Fumarate-FA (PRENATAL COMPLETE) 14-0.4 MG TABS Take 1 tablet by mouth daily. 60 each 1 Taking  . valACYclovir (VALTREX) 1000 MG tablet Take 1 tablet (1,000 mg total) by mouth 2 (two) times daily. 20 tablet 0 Taking    Review of Systems  Constitutional: Negative for fever and chills.  Gastrointestinal: Positive for abdominal pain and constipation (last BM 12/25/14 ). Negative for nausea, vomiting and diarrhea.  Genitourinary: Positive for vaginal discharge and pelvic pain. Negative for dysuria, urgency and frequency.   Physical Exam   Blood pressure 106/60, pulse 102, temperature 98 F (36.7 C), temperature source Oral, resp. rate 20, height 5' (1.524 m), weight 63.22 kg (139 lb 6 oz), last menstrual period 08/06/2015.  Constitutional: She is oriented to person, place, and time. She appears well-developed and well-nourished. No distress.  HENT:  Head: Normocephalic.  Cardiovascular: Normal rate.  Respiratory: Effort normal.  GI: Soft. There is no tenderness. There is no rebound.  Genitourinary:   External: no lesion Vagina: pooling of pink tinged fluid in the vagin Cervix: visually about 4-5 cm with fetal parts visible. Digital exam reveals fetal parts at the  os.  Uterus: AGA, FHT 155 with doppler   Neurological: She is alert and oriented to person, place, and time.  Skin: Skin is warm and dry.  Psychiatric: She has a normal mood and affect.    Assessment and Plan  PPROM, with advanced cervical dilation Admit to labor and delivery for delivery.  Discussion around pre-viability  had with patient and family.

## 2015-12-27 NOTE — Progress Notes (Signed)
CSW received consult for parental support for baby less than 32 weeks.  CSW understands that patient experienced an IUFD.  CSW spoke with Chaplain/K. Claussen who states the Spiritual Care Department has been involved in supporting patient and does not feel there is additional need for CSW services at this time.  CSW screening out referral.  Please consult CSW if needs arise or by patient's request.

## 2015-12-27 NOTE — Progress Notes (Signed)
Placenta delivered after pt was cathed.  It appears to be intact.  Bleeding miminal, fundus firm.  EBL <100cc

## 2015-12-28 LAB — RPR: RPR Ser Ql: NONREACTIVE

## 2015-12-30 ENCOUNTER — Telehealth (HOSPITAL_COMMUNITY): Payer: Self-pay | Admitting: *Deleted

## 2015-12-30 DIAGNOSIS — O98812 Other maternal infectious and parasitic diseases complicating pregnancy, second trimester: Principal | ICD-10-CM

## 2015-12-30 DIAGNOSIS — A749 Chlamydial infection, unspecified: Secondary | ICD-10-CM

## 2015-12-30 MED ORDER — AZITHROMYCIN 500 MG PO TABS: ORAL_TABLET | ORAL | Status: DC

## 2015-12-30 NOTE — Telephone Encounter (Signed)
Telephone call to patient regarding positive chlamydia culture, patient notified.  Rx routed to patients pharmacy for treatment.  Instructed patient refill of medication given for partner treatment.  Instructed patient to abstain from sex for seven days post treatment.  Report faxed to health department.

## 2015-12-31 ENCOUNTER — Encounter: Payer: BLUE CROSS/BLUE SHIELD | Admitting: Advanced Practice Midwife

## 2016-01-13 ENCOUNTER — Ambulatory Visit: Payer: BLUE CROSS/BLUE SHIELD | Admitting: Obstetrics and Gynecology

## 2016-01-16 ENCOUNTER — Ambulatory Visit (HOSPITAL_COMMUNITY): Payer: BLUE CROSS/BLUE SHIELD

## 2016-01-27 ENCOUNTER — Ambulatory Visit: Payer: BLUE CROSS/BLUE SHIELD | Admitting: Obstetrics & Gynecology

## 2016-01-30 ENCOUNTER — Ambulatory Visit: Payer: BLUE CROSS/BLUE SHIELD | Admitting: Obstetrics and Gynecology

## 2016-12-21 NOTE — L&D Delivery Note (Signed)
Patient is 21 y.o. G2P0100 5138w3d admitted IOL for BPP 4/10.   Delivery Note At 8:57 AM a viable and healthy female was delivered via  (Presentation:cephalic ;LOA  ).  APGAR: 8, 9; weight  pending.   Placenta status: spontaneous ,intact .  Cord: 3 vessel   Anesthesia:  epidural Episiotomy:  none Lacerations:  1st degree, hemostatic Suture Repair: none Est. Blood Loss (mL):  150  Mom to postpartum.  Baby to Couplet care / Skin to Skin.  Chubb Corporationmber Jaxyn Rout 11/19/2017, 9:09 AM

## 2017-03-18 ENCOUNTER — Encounter (HOSPITAL_COMMUNITY): Payer: Self-pay | Admitting: *Deleted

## 2017-03-18 ENCOUNTER — Inpatient Hospital Stay (HOSPITAL_COMMUNITY): Payer: BLUE CROSS/BLUE SHIELD

## 2017-03-18 ENCOUNTER — Inpatient Hospital Stay (HOSPITAL_COMMUNITY)
Admission: AD | Admit: 2017-03-18 | Discharge: 2017-03-18 | Disposition: A | Discharge status: Home / Self Care | Payer: BLUE CROSS/BLUE SHIELD | Source: Ambulatory Visit | Attending: Obstetrics and Gynecology | Admitting: Obstetrics and Gynecology

## 2017-03-18 DIAGNOSIS — O26899 Other specified pregnancy related conditions, unspecified trimester: Secondary | ICD-10-CM | POA: Diagnosis not present

## 2017-03-18 DIAGNOSIS — Z3A01 Less than 8 weeks gestation of pregnancy: Secondary | ICD-10-CM | POA: Insufficient documentation

## 2017-03-18 DIAGNOSIS — O26891 Other specified pregnancy related conditions, first trimester: Secondary | ICD-10-CM | POA: Diagnosis not present

## 2017-03-18 DIAGNOSIS — R110 Nausea: Secondary | ICD-10-CM | POA: Insufficient documentation

## 2017-03-18 DIAGNOSIS — O209 Hemorrhage in early pregnancy, unspecified: Secondary | ICD-10-CM

## 2017-03-18 DIAGNOSIS — Z79899 Other long term (current) drug therapy: Secondary | ICD-10-CM | POA: Diagnosis not present

## 2017-03-18 DIAGNOSIS — O3680X0 Pregnancy with inconclusive fetal viability, not applicable or unspecified: Secondary | ICD-10-CM

## 2017-03-18 DIAGNOSIS — O4692 Antepartum hemorrhage, unspecified, second trimester: Secondary | ICD-10-CM | POA: Insufficient documentation

## 2017-03-18 DIAGNOSIS — O09291 Supervision of pregnancy with other poor reproductive or obstetric history, first trimester: Secondary | ICD-10-CM

## 2017-03-18 LAB — URINALYSIS, ROUTINE W REFLEX MICROSCOPIC
BILIRUBIN URINE: NEGATIVE
Glucose, UA: NEGATIVE mg/dL
HGB URINE DIPSTICK: NEGATIVE
Ketones, ur: NEGATIVE mg/dL
NITRITE: POSITIVE — AB
PH: 7 (ref 5.0–8.0)
Protein, ur: NEGATIVE mg/dL
SPECIFIC GRAVITY, URINE: 1.017 (ref 1.005–1.030)

## 2017-03-18 LAB — CBC
HCT: 32.8 % — ABNORMAL LOW (ref 36.0–46.0)
Hemoglobin: 9.9 g/dL — ABNORMAL LOW (ref 12.0–15.0)
MCH: 20.3 pg — AB (ref 26.0–34.0)
MCHC: 30.2 g/dL (ref 30.0–36.0)
MCV: 67.4 fL — AB (ref 78.0–100.0)
PLATELETS: 347 10*3/uL (ref 150–400)
RBC: 4.87 MIL/uL (ref 3.87–5.11)
RDW: 19.8 % — AB (ref 11.5–15.5)
WBC: 13.9 10*3/uL — ABNORMAL HIGH (ref 4.0–10.5)

## 2017-03-18 LAB — WET PREP, GENITAL
CLUE CELLS WET PREP: NONE SEEN
Sperm: NONE SEEN
Trich, Wet Prep: NONE SEEN
Yeast Wet Prep HPF POC: NONE SEEN

## 2017-03-18 LAB — HCG, QUANTITATIVE, PREGNANCY: HCG, BETA CHAIN, QUANT, S: 3261 m[IU]/mL — AB (ref <5)

## 2017-03-18 LAB — POCT PREGNANCY, URINE: PREG TEST UR: POSITIVE — AB

## 2017-03-18 MED ORDER — PRENATAL VITAMIN PLUS LOW IRON 27-1 MG PO TABS: 1.0000 | ORAL_TABLET | ORAL | 5 refills | Status: AC

## 2017-03-18 MED ORDER — DOXYLAMINE-PYRIDOXINE 10-10 MG PO TBEC: 1.0000 | DELAYED_RELEASE_TABLET | Freq: Two times a day (BID) | ORAL | 0 refills | Status: DC
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
## 2017-03-18 NOTE — MAU Note (Signed)
Pt stated she had several positive pregnancy test. Feeling nauseated no vomiting. Also c/o some mild cramping for the past 2weeks .

## 2017-03-18 NOTE — Discharge Instructions (Signed)
Ectopic Pregnancy °An ectopic pregnancy is when the fertilized egg attaches (implants) outside the uterus. Most ectopic pregnancies occur in one of the tubes where eggs travel from the ovary to the uterus (fallopian tubes), but the implanting can occur in other locations. In rare cases, ectopic pregnancies occur on the ovary, intestine, pelvis, abdomen, or cervix. In an ectopic pregnancy, the fertilized egg does not have the ability to develop into a normal, healthy baby. °A ruptured ectopic pregnancy is one in which tearing or bursting of a fallopian tube causes internal bleeding. Often, there is intense lower abdominal pain, and vaginal bleeding sometimes occurs. Having an ectopic pregnancy can be life-threatening. If this dangerous condition is not treated, it can lead to blood loss, shock, or even death. °What are the causes? °The most common cause of this condition is damage to one of the fallopian tubes. A fallopian tube may be narrowed or blocked, and that keeps the fertilized egg from reaching the uterus. °What increases the risk? °This condition is more likely to develop in women of childbearing age who have different levels of risk. The levels of risk can be divided into three categories. °High risk  °· You have gone through infertility treatment. °· You have had an ectopic pregnancy before. °· You have had surgery on the fallopian tubes, or another surgical procedure, such as an abortion. °· You have had surgery to have the fallopian tubes tied (tubal ligation). °· You have problems or diseases of the fallopian tubes. °· You have been exposed to diethylstilbestrol (DES). This medicine was used until 1971, and it had effects on babies whose mothers took the medicine. °· You become pregnant while using an IUD (intrauterine device) for birth control. °Moderate risk  °· You have a history of infertility. °· You have had an STI (sexually transmitted infection). °· You have a history of pelvic inflammatory  disease (PID). °· You have scarring from endometriosis. °· You have multiple sexual partners. °· You smoke. °Low risk  °· You have had pelvic surgery. °· You use vaginal douches. °· You became sexually active before age 18. °What are the signs or symptoms? °Common symptoms of this condition include normal pregnancy symptoms, such as missing a period, nausea, tiredness, abdominal pain, breast tenderness, and bleeding. However, ectopic pregnancy will have additional symptoms, such as: °· Pain with intercourse. °· Irregular vaginal bleeding or spotting. °· Cramping or pain on one side or in the lower abdomen. °· Fast heartbeat, low blood pressure, and sweating. °· Passing out while having a bowel movement. °Symptoms of a ruptured ectopic pregnancy and internal bleeding may include: °· Sudden, severe pain in the abdomen and pelvis. °· Dizziness, weakness, light-headedness, or fainting. °· Pain in the shoulder or neck area. °How is this diagnosed? °This condition is diagnosed by: °· A pelvic exam to locate pain or a mass in the abdomen. °· A pregnancy test. This blood test checks for the presence as well as the specific level of pregnancy hormone in the bloodstream. °· Ultrasound. This is performed if a pregnancy test is positive. In this test, a probe is inserted into the vagina. The probe will detect a fetus, possibly in a location other than the uterus. °· Taking a sample of uterus tissue (dilation and curettage, or D&C). °· Surgery to perform a visual exam of the inside of the abdomen using a thin, lighted tube that has a tiny camera on the end (laparoscope). °· Culdocentesis. This procedure involves inserting a needle at the   top of the vagina, behind the uterus. If blood is present in this area, it may indicate that a fallopian tube is torn. How is this treated? This condition is treated with medicine or surgery. Medicine   An injection of a medicine (methotrexate) may be given to cause the pregnancy tissue to  be absorbed. This medicine may save your fallopian tube. It may be given if:  The diagnosis is made early, with no signs of active bleeding.  The fallopian tube has not ruptured.  You are considered to be a good candidate for the medicine. Usually, pregnancy hormone blood levels are checked after methotrexate treatment. This is to be sure that the medicine is effective. It may take 4-6 weeks for the pregnancy to be absorbed. Most pregnancies will be absorbed by 3 weeks. Surgery   A laparoscope may be used to remove the pregnancy tissue.  If severe internal bleeding occurs, a larger cut (incision) may be made in the lower abdomen (laparotomy) to remove the fetus and placenta. This is done to stop the bleeding.  Part or all of the fallopian tube may be removed (salpingectomy) along with the fetus and placenta. The fallopian tube may also be repaired during the surgery.  In very rare circumstances, removal of the uterus (hysterectomy) may be required.  After surgery, pregnancy hormone testing may be done to be sure that there is no pregnancy tissue left. Whether your treatment is medicine or surgery, you may receive a Rho (D) immune globulin shot to prevent problems with any future pregnancy. This shot may be given if:  You are Rh-negative and the baby's father is Rh-positive.  You are Rh-negative and you do not know the Rh type of the baby's father. Follow these instructions at home:  Rest and limit your activity after the procedure for as long as told by your health care provider.  Until your health care provider says that it is safe:  Do not lift anything that is heavier than 10 lb (4.5 kg), or the limit that your health care provider tells you.  Avoid physical exercise and any movement that requires effort (is strenuous).  To help prevent constipation:  Eat a healthy diet that includes fruits, vegetables, and whole grains.  Drink 6-8 glasses of water per day. Get help right  away if:  You develop worsening pain that is not relieved by medicine.  You have:  A fever or chills.  Vaginal bleeding.  Redness and swelling at the incision site.  Nausea and vomiting.  You feel dizzy or weak.  You feel light-headed or you faint. This information is not intended to replace advice given to you by your health care provider. Make sure you discuss any questions you have with your health care provider. Document Released: 01/14/2005 Document Revised: 08/05/2016 Document Reviewed: 07/08/2016 Elsevier Interactive Patient Education  2017 Elsevier Inc. Morning Sickness Morning sickness is when you feel sick to your stomach (nauseous) during pregnancy. This nauseous feeling may or may not come with vomiting. It often occurs in the morning but can be a problem any time of day. Morning sickness is most common during the first trimester, but it may continue throughout pregnancy. While morning sickness is unpleasant, it is usually harmless unless you develop severe and continual vomiting (hyperemesis gravidarum). This condition requires more intense treatment. What are the causes? The cause of morning sickness is not completely known but seems to be related to normal hormonal changes that occur in pregnancy. What increases the risk?  You are at greater risk if you:  Experienced nausea or vomiting before your pregnancy.  Had morning sickness during a previous pregnancy.  Are pregnant with more than one baby, such as twins. How is this treated? Do not use any medicines (prescription, over-the-counter, or herbal) for morning sickness without first talking to your health care provider. Your health care provider may prescribe or recommend:  Vitamin B6 supplements.  Anti-nausea medicines.  The herbal medicine ginger. Follow these instructions at home:  Only take over-the-counter or prescription medicines as directed by your health care provider.  Taking multivitamins before  getting pregnant can prevent or decrease the severity of morning sickness in most women.  Eat a piece of dry toast or unsalted crackers before getting out of bed in the morning.  Eat five or six small meals a day.  Eat dry and bland foods (rice, baked potato). Foods high in carbohydrates are often helpful.  Do not drink liquids with your meals. Drink liquids between meals.  Avoid greasy, fatty, and spicy foods.  Get someone to cook for you if the smell of any food causes nausea and vomiting.  If you feel nauseous after taking prenatal vitamins, take the vitamins at night or with a snack.  Snack on protein foods (nuts, yogurt, cheese) between meals if you are hungry.  Eat unsweetened gelatins for desserts.  Wearing an acupressure wristband (worn for sea sickness) may be helpful.  Acupuncture may be helpful.  Do not smoke.  Get a humidifier to keep the air in your house free of odors.  Get plenty of fresh air. Contact a health care provider if:  Your home remedies are not working, and you need medicine.  You feel dizzy or lightheaded.  You are losing weight. Get help right away if:  You have persistent and uncontrolled nausea and vomiting.  You pass out (faint). This information is not intended to replace advice given to you by your health care provider. Make sure you discuss any questions you have with your health care provider. Document Released: 01/28/2007 Document Revised: 05/14/2016 Document Reviewed: 05/24/2013 Elsevier Interactive Patient Education  2017 [employer]

## 2017-03-19 LAB — HIV ANTIBODY (ROUTINE TESTING W REFLEX): HIV SCREEN 4TH GENERATION: NONREACTIVE

## 2017-03-19 LAB — RPR: RPR Ser Ql: NONREACTIVE

## 2017-03-19 LAB — GC/CHLAMYDIA PROBE AMP (~~LOC~~) NOT AT ARMC
CHLAMYDIA, DNA PROBE: POSITIVE — AB
Neisseria Gonorrhea: NEGATIVE

## 2017-03-21 LAB — URINE CULTURE: Special Requests: NORMAL

## 2017-03-22 ENCOUNTER — Telehealth (HOSPITAL_COMMUNITY): Payer: Self-pay | Admitting: *Deleted

## 2017-03-22 ENCOUNTER — Other Ambulatory Visit: Payer: Self-pay | Admitting: Obstetrics and Gynecology

## 2017-03-22 ENCOUNTER — Other Ambulatory Visit: Payer: Self-pay | Admitting: Obstetrics & Gynecology

## 2017-03-22 DIAGNOSIS — O2341 Unspecified infection of urinary tract in pregnancy, first trimester: Secondary | ICD-10-CM

## 2017-03-22 MED ORDER — AZITHROMYCIN 250 MG PO TABS: 1000.0000 mg | ORAL_TABLET | Freq: Once | ORAL | Status: DC

## 2017-03-22 MED ORDER — AZITHROMYCIN 250 MG PO TABS: 250.0000 mg | ORAL_TABLET | ORAL | 0 refills | Status: DC

## 2017-03-22 MED ORDER — AMOXICILLIN 500 MG PO CAPS: 500.0000 mg | ORAL_CAPSULE | Freq: Three times a day (TID) | ORAL | 2 refills | Status: DC

## 2017-03-22 NOTE — Telephone Encounter (Signed)
2nd/Final attempt to contact patient regarding patient's STD lab results from her last visit. Had to leave another message on patient's voicemail to call back at her convenience. Communicable Disease Report faxed to the health clinic.   

## 2017-03-22 NOTE — Addendum Note (Signed)
Addended by: Venia Carbon I on: 03/22/2017 02:44 PM   Modules accepted: Orders

## 2017-03-22 NOTE — Telephone Encounter (Signed)

## 2017-03-22 NOTE — Telephone Encounter (Signed)
1st attempt to contact patient regarding patient's STD lab results from her last visit. Left message on patient's voice mail to call back at her convenience. 

## 2017-03-22 NOTE — Progress Notes (Signed)
RX for azithromycin sent in.  + chlamydia   Duane Lope, NP 03/22/2017 2:35 PM

## 2017-03-23 ENCOUNTER — Encounter: Payer: Self-pay | Admitting: General Practice

## 2017-03-24 ENCOUNTER — Telehealth: Payer: Self-pay | Admitting: *Deleted

## 2017-03-24 NOTE — Telephone Encounter (Signed)
Message left on 4/2 by pharmacy requesting clarification of dosage instructions for Azithromycin. I called and provided the requested information to "Misty Stanley". Per chart review, pt has been notified of test results and treatment plan.

## 2017-04-26 ENCOUNTER — Encounter: Payer: BLUE CROSS/BLUE SHIELD | Admitting: Certified Nurse Midwife

## 2017-06-04 ENCOUNTER — Encounter (HOSPITAL_COMMUNITY): Payer: Self-pay

## 2017-06-04 ENCOUNTER — Inpatient Hospital Stay (HOSPITAL_COMMUNITY): Payer: BLUE CROSS/BLUE SHIELD

## 2017-06-04 ENCOUNTER — Inpatient Hospital Stay (HOSPITAL_COMMUNITY)
Admission: AD | Admit: 2017-06-04 | Discharge: 2017-06-04 | Disposition: A | Discharge status: Home / Self Care | Payer: BLUE CROSS/BLUE SHIELD | Source: Ambulatory Visit | Attending: Obstetrics and Gynecology | Admitting: Obstetrics and Gynecology

## 2017-06-04 DIAGNOSIS — Z3A16 16 weeks gestation of pregnancy: Secondary | ICD-10-CM | POA: Diagnosis present

## 2017-06-04 DIAGNOSIS — O4692 Antepartum hemorrhage, unspecified, second trimester: Secondary | ICD-10-CM | POA: Diagnosis present

## 2017-06-04 DIAGNOSIS — O9A212 Injury, poisoning and certain other consequences of external causes complicating pregnancy, second trimester: Secondary | ICD-10-CM | POA: Insufficient documentation

## 2017-06-04 DIAGNOSIS — A599 Trichomoniasis, unspecified: Secondary | ICD-10-CM

## 2017-06-04 DIAGNOSIS — O26852 Spotting complicating pregnancy, second trimester: Secondary | ICD-10-CM

## 2017-06-04 DIAGNOSIS — O0932 Supervision of pregnancy with insufficient antenatal care, second trimester: Secondary | ICD-10-CM | POA: Diagnosis not present

## 2017-06-04 DIAGNOSIS — O9989 Other specified diseases and conditions complicating pregnancy, childbirth and the puerperium: Secondary | ICD-10-CM | POA: Diagnosis not present

## 2017-06-04 DIAGNOSIS — O09212 Supervision of pregnancy with history of pre-term labor, second trimester: Secondary | ICD-10-CM | POA: Diagnosis not present

## 2017-06-04 DIAGNOSIS — Z202 Contact with and (suspected) exposure to infections with a predominantly sexual mode of transmission: Secondary | ICD-10-CM

## 2017-06-04 DIAGNOSIS — T1490XA Injury, unspecified, initial encounter: Secondary | ICD-10-CM | POA: Insufficient documentation

## 2017-06-04 DIAGNOSIS — Y92410 Unspecified street and highway as the place of occurrence of the external cause: Secondary | ICD-10-CM | POA: Diagnosis not present

## 2017-06-04 HISTORY — DX: Chlamydial infection, unspecified: A74.9

## 2017-06-04 LAB — URINALYSIS, ROUTINE W REFLEX MICROSCOPIC
BILIRUBIN URINE: NEGATIVE
Glucose, UA: NEGATIVE mg/dL
HGB URINE DIPSTICK: NEGATIVE
Ketones, ur: NEGATIVE mg/dL
NITRITE: NEGATIVE
PROTEIN: NEGATIVE mg/dL
Specific Gravity, Urine: 1.011 (ref 1.005–1.030)
pH: 6 (ref 5.0–8.0)

## 2017-06-04 LAB — WET PREP, GENITAL
Clue Cells Wet Prep HPF POC: NONE SEEN
Sperm: NONE SEEN
YEAST WET PREP: NONE SEEN

## 2017-06-04 MED ORDER — AZITHROMYCIN 250 MG PO TABS: 1000.0000 mg | ORAL_TABLET | Freq: Every day | ORAL | 0 refills | Status: DC

## 2017-06-04 MED ORDER — CEFTRIAXONE SODIUM 250 MG IJ SOLR
250.0000 mg | Freq: Once | INTRAMUSCULAR | Status: AC
  Administered 2017-06-04: 250 mg via INTRAMUSCULAR
  Filled 2017-06-04: qty 250

## 2017-06-04 MED ORDER — METRONIDAZOLE 500 MG PO TABS
2000.0000 mg | ORAL_TABLET | Freq: Once | ORAL | Status: AC
  Administered 2017-06-04: 2000 mg via ORAL
  Filled 2017-06-04: qty 4
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
## 2017-06-04 NOTE — Progress Notes (Signed)
Patient given crackers and sprite prior to antibiotic administration.
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
## 2017-06-04 NOTE — Discharge Instructions (Signed)
Trichomonas Test Why am I having this test? The trichomonas test is done to diagnose trichomoniasis, an infection caused by an organism called Trichomonas. Trichomoniasis is a sexually transmitted infection (STI). In women, it causes vaginal infections. In men, it can cause the tube that carries urine (urethra) to become inflamed (urethritis). You may have this test as a part of a routine screening for STIs or if you have symptoms of trichomoniasis. To perform the test, your health care provider will take a sample of discharge. The sample is taken from the vagina or cervix in women and from the urethra in men. A urine sample can also be used for testing. What do the results mean? It is your responsibility to obtain your test results. Ask the lab or department performing the test when and how you will get your results. Contact your health care provider to discuss any questions you have about your results. Negative test results A negative test means you do not have trichomoniasis. Follow your health care provider's directions about any follow-up testing. Positive test results A positive test result means you have an active infection that needs to be treated with antibiotic medicine. All your current sexual partners must also be treated or it is likely you will get reinfected. If your test is positive, your health care provider will start you on medicine and may advise you to:  Not have sexual intercourse until your infection has cleared up.  Use a latex condom properly every time you have sexual intercourse.  Limit the number of sexual partners you have. The more partners you have, the greater your risk of contracting trichomoniasis or another STI.  Tell all sexual partners about your infection so they can also be treated and to prevent reinfection.  Talk with your health care provider to discuss your results, treatment options, and if necessary, the need for more tests. Talk with your health care  provider if you have any questions about your results. This information is not intended to replace advice given to you by your health care provider. Make sure you discuss any questions you have with your health care provider. Document Released: 01/09/2005 Document Revised: 07/08/2016 Document Reviewed: 12/19/2013 Elsevier Interactive Patient Education  2017 Elsevier Inc.  

## 2017-06-06 LAB — CULTURE, OB URINE
Culture: >=100000 — AB
SPECIAL REQUESTS: NORMAL

## 2017-06-07 ENCOUNTER — Telehealth: Payer: Self-pay | Admitting: Obstetrics and Gynecology

## 2017-06-07 LAB — GC/CHLAMYDIA PROBE AMP (~~LOC~~) NOT AT ARMC
Chlamydia: POSITIVE — AB
Neisseria Gonorrhea: NEGATIVE

## 2017-06-07 MED ORDER — CEPHALEXIN 500 MG PO CAPS: 500.0000 mg | ORAL_CAPSULE | Freq: Three times a day (TID) | ORAL | 0 refills | Status: DC

## 2017-06-07 NOTE — Telephone Encounter (Signed)
Urine culture reviewed. Spoke with patient. Treat with keflex. Prescription sent.

## 2017-06-08 ENCOUNTER — Telehealth (HOSPITAL_COMMUNITY): Payer: Self-pay | Admitting: *Deleted

## 2017-06-08 NOTE — Telephone Encounter (Signed)

## 2017-06-29 ENCOUNTER — Encounter: Payer: BLUE CROSS/BLUE SHIELD | Admitting: Certified Nurse Midwife

## 2017-07-09 ENCOUNTER — Encounter: Payer: Self-pay | Admitting: Certified Nurse Midwife

## 2017-07-09 ENCOUNTER — Other Ambulatory Visit (HOSPITAL_COMMUNITY)
Admission: RE | Admit: 2017-07-09 | Discharge: 2017-07-09 | Disposition: A | Discharge status: Home / Self Care | Payer: BLUE CROSS/BLUE SHIELD | Source: Ambulatory Visit | Attending: Certified Nurse Midwife | Admitting: Certified Nurse Midwife

## 2017-07-09 ENCOUNTER — Ambulatory Visit (INDEPENDENT_AMBULATORY_CARE_PROVIDER_SITE_OTHER): Payer: BLUE CROSS/BLUE SHIELD | Admitting: Certified Nurse Midwife

## 2017-07-09 VITALS — BP 103/64 | HR 84 | Wt 120.8 lb

## 2017-07-09 DIAGNOSIS — O09292 Supervision of pregnancy with other poor reproductive or obstetric history, second trimester: Secondary | ICD-10-CM

## 2017-07-09 DIAGNOSIS — Z3480 Encounter for supervision of other normal pregnancy, unspecified trimester: Secondary | ICD-10-CM

## 2017-07-09 DIAGNOSIS — Z331 Pregnant state, incidental: Secondary | ICD-10-CM | POA: Diagnosis not present

## 2017-07-09 DIAGNOSIS — Z3482 Encounter for supervision of other normal pregnancy, second trimester: Secondary | ICD-10-CM | POA: Diagnosis present

## 2017-07-09 DIAGNOSIS — A749 Chlamydial infection, unspecified: Secondary | ICD-10-CM

## 2017-07-09 DIAGNOSIS — O219 Vomiting of pregnancy, unspecified: Secondary | ICD-10-CM

## 2017-07-09 DIAGNOSIS — O0930 Supervision of pregnancy with insufficient antenatal care, unspecified trimester: Secondary | ICD-10-CM

## 2017-07-09 DIAGNOSIS — Z3A21 21 weeks gestation of pregnancy: Secondary | ICD-10-CM | POA: Insufficient documentation

## 2017-07-09 DIAGNOSIS — Z113 Encounter for screening for infections with a predominantly sexual mode of transmission: Secondary | ICD-10-CM | POA: Diagnosis not present

## 2017-07-09 DIAGNOSIS — O0932 Supervision of pregnancy with insufficient antenatal care, second trimester: Secondary | ICD-10-CM

## 2017-07-09 DIAGNOSIS — T7491XS Unspecified adult maltreatment, confirmed, sequela: Secondary | ICD-10-CM

## 2017-07-09 DIAGNOSIS — T7491XA Unspecified adult maltreatment, confirmed, initial encounter: Secondary | ICD-10-CM | POA: Insufficient documentation

## 2017-07-09 MED ORDER — PRENATE PIXIE 10-0.6-0.4-200 MG PO CAPS: 1.0000 | ORAL_CAPSULE | Freq: Every day | ORAL | 12 refills | Status: DC

## 2017-07-09 MED ORDER — ONDANSETRON HCL 8 MG PO TABS: 8.0000 mg | ORAL_TABLET | Freq: Three times a day (TID) | ORAL | 2 refills | Status: DC | PRN

## 2017-07-09 NOTE — Progress Notes (Signed)
Patient is in the office for initial ob visit, reports feeling fetal movement, denies pain. 
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
## 2017-07-13 LAB — CERVICOVAGINAL ANCILLARY ONLY
Bacterial vaginitis: POSITIVE — AB
CHLAMYDIA, DNA PROBE: POSITIVE — AB
Candida vaginitis: NEGATIVE
NEISSERIA GONORRHEA: NEGATIVE
TRICH (WINDOWPATH): NEGATIVE

## 2017-07-13 NOTE — Addendum Note (Signed)
Addended by: Pennie BanterSMITH, Seren Chaloux W on: 07/13/2017 12:44 PM   Modules accepted: Orders

## 2017-07-14 ENCOUNTER — Other Ambulatory Visit: Payer: Self-pay | Admitting: Certified Nurse Midwife

## 2017-07-14 ENCOUNTER — Ambulatory Visit (HOSPITAL_COMMUNITY)
Admission: RE | Admit: 2017-07-14 | Discharge: 2017-07-14 | Disposition: A | Discharge status: Home / Self Care | Payer: BLUE CROSS/BLUE SHIELD | Source: Ambulatory Visit | Attending: Certified Nurse Midwife | Admitting: Certified Nurse Midwife

## 2017-07-14 ENCOUNTER — Telehealth: Payer: Self-pay

## 2017-07-14 DIAGNOSIS — B3731 Acute candidiasis of vulva and vagina: Secondary | ICD-10-CM

## 2017-07-14 DIAGNOSIS — A749 Chlamydial infection, unspecified: Secondary | ICD-10-CM

## 2017-07-14 DIAGNOSIS — N760 Acute vaginitis: Secondary | ICD-10-CM

## 2017-07-14 DIAGNOSIS — Z363 Encounter for antenatal screening for malformations: Secondary | ICD-10-CM | POA: Insufficient documentation

## 2017-07-14 DIAGNOSIS — O09292 Supervision of pregnancy with other poor reproductive or obstetric history, second trimester: Secondary | ICD-10-CM | POA: Diagnosis not present

## 2017-07-14 DIAGNOSIS — O0932 Supervision of pregnancy with insufficient antenatal care, second trimester: Secondary | ICD-10-CM | POA: Diagnosis not present

## 2017-07-14 DIAGNOSIS — Z3A22 22 weeks gestation of pregnancy: Secondary | ICD-10-CM | POA: Insufficient documentation

## 2017-07-14 DIAGNOSIS — B9689 Other specified bacterial agents as the cause of diseases classified elsewhere: Secondary | ICD-10-CM

## 2017-07-14 DIAGNOSIS — B373 Candidiasis of vulva and vagina: Secondary | ICD-10-CM

## 2017-07-14 MED ORDER — METRONIDAZOLE 500 MG PO TABS: 500.0000 mg | ORAL_TABLET | Freq: Two times a day (BID) | ORAL | 0 refills | Status: DC

## 2017-07-14 MED ORDER — TERCONAZOLE 0.8 % VA CREA: 1.0000 | TOPICAL_CREAM | Freq: Every day | VAGINAL | 0 refills | Status: DC

## 2017-07-14 MED ORDER — FLUCONAZOLE 150 MG PO TABS: 150.0000 mg | ORAL_TABLET | Freq: Once | ORAL | 0 refills | Status: AC

## 2017-07-14 MED ORDER — AZITHROMYCIN 250 MG PO TABS: ORAL_TABLET | ORAL | 0 refills | Status: DC

## 2017-07-14 NOTE — Telephone Encounter (Signed)
Called pt to give her results and go over treatment plan, pt stated she was on her way to u/s appt and could not talk, stated she would call us back

## 2017-07-15 ENCOUNTER — Other Ambulatory Visit: Payer: Self-pay | Admitting: Certified Nurse Midwife

## 2017-07-15 ENCOUNTER — Telehealth: Payer: Self-pay

## 2017-07-15 DIAGNOSIS — O99820 Streptococcus B carrier state complicating pregnancy: Secondary | ICD-10-CM | POA: Insufficient documentation

## 2017-07-15 DIAGNOSIS — A749 Chlamydial infection, unspecified: Secondary | ICD-10-CM

## 2017-07-15 DIAGNOSIS — O09292 Supervision of pregnancy with other poor reproductive or obstetric history, second trimester: Secondary | ICD-10-CM

## 2017-07-15 LAB — URINE CULTURE, OB REFLEX

## 2017-07-15 LAB — CULTURE, OB URINE

## 2017-07-15 MED ORDER — AZITHROMYCIN 250 MG PO TABS: ORAL_TABLET | ORAL | 0 refills | Status: DC

## 2017-07-15 MED ORDER — NITROFURANTOIN MONOHYD MACRO 100 MG PO CAPS: 100.0000 mg | ORAL_CAPSULE | Freq: Two times a day (BID) | ORAL | 0 refills | Status: DC

## 2017-07-15 NOTE — Telephone Encounter (Signed)
error 

## 2017-07-15 NOTE — Telephone Encounter (Signed)
S/w pt and advised of results, treatment plan and rx sent. 

## 2017-07-15 NOTE — Progress Notes (Signed)
[ ]   GBS
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
## 2017-07-16 ENCOUNTER — Other Ambulatory Visit: Payer: Self-pay | Admitting: Certified Nurse Midwife

## 2017-07-16 DIAGNOSIS — O09292 Supervision of pregnancy with other poor reproductive or obstetric history, second trimester: Secondary | ICD-10-CM

## 2017-07-16 LAB — MATERNIT21 PLUS CORE+SCA
CHROMOSOME 21: NEGATIVE
Chromosome 13: NEGATIVE
Chromosome 18: NEGATIVE
Y Chromosome: DETECTED

## 2017-07-27 ENCOUNTER — Other Ambulatory Visit: Payer: Self-pay | Admitting: Certified Nurse Midwife

## 2017-07-27 DIAGNOSIS — R7989 Other specified abnormal findings of blood chemistry: Secondary | ICD-10-CM | POA: Insufficient documentation

## 2017-07-27 DIAGNOSIS — O09292 Supervision of pregnancy with other poor reproductive or obstetric history, second trimester: Secondary | ICD-10-CM

## 2017-07-27 LAB — OBSTETRIC PANEL, INCLUDING HIV
ANTIBODY SCREEN: NEGATIVE
BASOS: 0 %
Basophils Absolute: 0 10*3/uL (ref 0.0–0.2)
EOS (ABSOLUTE): 0 10*3/uL (ref 0.0–0.4)
EOS: 0 %
HEMATOCRIT: 30.3 % — AB (ref 34.0–46.6)
HEMOGLOBIN: 9.3 g/dL — AB (ref 11.1–15.9)
HEP B S AG: NEGATIVE
HIV Screen 4th Generation wRfx: NONREACTIVE
IMMATURE GRANULOCYTES: 1 %
Immature Grans (Abs): 0.1 10*3/uL (ref 0.0–0.1)
LYMPHS: 17 %
Lymphocytes Absolute: 1.9 10*3/uL (ref 0.7–3.1)
MCH: 21.2 pg — ABNORMAL LOW (ref 26.6–33.0)
MCHC: 30.7 g/dL — AB (ref 31.5–35.7)
MCV: 69 fL — ABNORMAL LOW (ref 79–97)
MONOCYTES: 5 %
MONOS ABS: 0.6 10*3/uL (ref 0.1–0.9)
Neutrophils Absolute: 8.3 10*3/uL — ABNORMAL HIGH (ref 1.4–7.0)
Neutrophils: 77 %
Platelets: 295 10*3/uL (ref 150–379)
RBC: 4.38 x10E6/uL (ref 3.77–5.28)
RDW: 20 % — ABNORMAL HIGH (ref 12.3–15.4)
RH TYPE: POSITIVE
RPR: NONREACTIVE
RUBELLA: 14.7 {index} (ref >0.99)
WBC: 11 10*3/uL — ABNORMAL HIGH (ref 3.4–10.8)

## 2017-07-27 LAB — INHERITEST SOCIETY GUIDED

## 2017-07-27 LAB — HEMOGLOBIN A1C
Est. average glucose Bld gHb Est-mCnc: 103 mg/dL
Hgb A1c MFr Bld: 5.2 % (ref 4.8–5.6)

## 2017-07-27 LAB — HEMOGLOBINOPATHY EVALUATION
HEMOGLOBIN A2 QUANTITATION: 2.1 % (ref 1.8–3.2)
HGB C: 0 %
HGB S: 0 %
HGB VARIANT: 0 %
Hemoglobin F Quantitation: 0 % (ref 0.0–2.0)
Hgb A: 97.9 % (ref 96.4–98.8)

## 2017-07-27 LAB — VARICELLA ZOSTER ANTIBODY, IGG: VARICELLA: 2097 {index} (ref >165)

## 2017-07-27 LAB — VITAMIN D 25 HYDROXY (VIT D DEFICIENCY, FRACTURES): Vit D, 25-Hydroxy: 16.2 ng/mL — ABNORMAL LOW (ref 30.0–100.0)

## 2017-07-27 MED ORDER — VITAMIN D (ERGOCALCIFEROL) 1.25 MG (50000 UNIT) PO CAPS: 50000.0000 [IU] | ORAL_CAPSULE | ORAL | 2 refills | Status: AC

## 2017-08-05 ENCOUNTER — Ambulatory Visit (INDEPENDENT_AMBULATORY_CARE_PROVIDER_SITE_OTHER): Payer: BLUE CROSS/BLUE SHIELD | Admitting: Obstetrics and Gynecology

## 2017-08-05 VITALS — BP 111/66 | HR 83 | Wt 132.0 lb

## 2017-08-05 DIAGNOSIS — O09292 Supervision of pregnancy with other poor reproductive or obstetric history, second trimester: Secondary | ICD-10-CM

## 2017-08-05 DIAGNOSIS — O0930 Supervision of pregnancy with insufficient antenatal care, unspecified trimester: Secondary | ICD-10-CM

## 2017-08-05 DIAGNOSIS — O99820 Streptococcus B carrier state complicating pregnancy: Secondary | ICD-10-CM
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
## 2017-08-05 NOTE — Patient Instructions (Signed)
Contraception Choices Contraception (birth control) is the use of any methods or devices to prevent pregnancy. Below are some methods to help avoid pregnancy. Hormonal methods  Contraceptive implant. This is a thin, plastic tube containing progesterone hormone. It does not contain estrogen hormone. Your health care provider inserts the tube in the inner part of the upper arm. The tube can remain in place for up to 3 years. After 3 years, the implant must be removed. The implant prevents the ovaries from releasing an egg (ovulation), thickens the cervical mucus to prevent sperm from entering the uterus, and thins the lining of the inside of the uterus.  Progesterone-only injections. These injections are given every 3 months by your health care provider to prevent pregnancy. This synthetic progesterone hormone stops the ovaries from releasing eggs. It also thickens cervical mucus and changes the uterine lining. This makes it harder for sperm to survive in the uterus.  Birth control pills. These pills contain estrogen and progesterone hormone. They work by preventing the ovaries from releasing eggs (ovulation). They also cause the cervical mucus to thicken, preventing the sperm from entering the uterus. Birth control pills are prescribed by a health care provider.Birth control pills can also be used to treat heavy periods.  Minipill. This type of birth control pill contains only the progesterone hormone. They are taken every day of each month and must be prescribed by your health care provider.  Birth control patch. The patch contains hormones similar to those in birth control pills. It must be changed once a week and is prescribed by a health care provider.  Vaginal ring. The ring contains hormones similar to those in birth control pills. It is left in the vagina for 3 weeks, removed for 1 week, and then a new one is put back in place. The patient must be comfortable inserting and removing the ring from  the vagina.A health care provider's prescription is necessary.  Emergency contraception. Emergency contraceptives prevent pregnancy after unprotected sexual intercourse. This pill can be taken right after sex or up to 5 days after unprotected sex. It is most effective the sooner you take the pills after having sexual intercourse. Most emergency contraceptive pills are available without a prescription. Check with your pharmacist. Do not use emergency contraception as your only form of birth control. Barrier methods  Female condom. This is a thin sheath (latex or rubber) that is worn over the penis during sexual intercourse. It can be used with spermicide to increase effectiveness.  Female condom. This is a soft, loose-fitting sheath that is put into the vagina before sexual intercourse.  Diaphragm. This is a soft, latex, dome-shaped barrier that must be fitted by a health care provider. It is inserted into the vagina, along with a spermicidal jelly. It is inserted before intercourse. The diaphragm should be left in the vagina for 6 to 8 hours after intercourse.  Cervical cap. This is a round, soft, latex or plastic cup that fits over the cervix and must be fitted by a health care provider. The cap can be left in place for up to 48 hours after intercourse.  Sponge. This is a soft, circular piece of polyurethane foam. The sponge has spermicide in it. It is inserted into the vagina after wetting it and before sexual intercourse.  Spermicides. These are chemicals that kill or block sperm from entering the cervix and uterus. They come in the form of creams, jellies, suppositories, foam, or tablets. They do not require a prescription. They   are inserted into the vagina with an applicator before having sexual intercourse. The process must be repeated every time you have sexual intercourse. Intrauterine contraception  Intrauterine device (IUD). This is a T-shaped device that is put in a woman's uterus during  a menstrual period to prevent pregnancy. There are 2 types: ? Copper IUD. This type of IUD is wrapped in copper wire and is placed inside the uterus. Copper makes the uterus and fallopian tubes produce a fluid that kills sperm. It can stay in place for 10 years. ? Hormone IUD. This type of IUD contains the hormone progestin (synthetic progesterone). The hormone thickens the cervical mucus and prevents sperm from entering the uterus, and it also thins the uterine lining to prevent implantation of a fertilized egg. The hormone can weaken or kill the sperm that get into the uterus. It can stay in place for 3-5 years, depending on which type of IUD is used. Permanent methods of contraception  Female tubal ligation. This is when the woman's fallopian tubes are surgically sealed, tied, or blocked to prevent the egg from traveling to the uterus.  Hysteroscopic sterilization. This involves placing a small coil or insert into each fallopian tube. Your doctor uses a technique called hysteroscopy to do the procedure. The device causes scar tissue to form. This results in permanent blockage of the fallopian tubes, so the sperm cannot fertilize the egg. It takes about 3 months after the procedure for the tubes to become blocked. You must use another form of birth control for these 3 months.  Female sterilization. This is when the female has the tubes that carry sperm tied off (vasectomy).This blocks sperm from entering the vagina during sexual intercourse. After the procedure, the man can still ejaculate fluid (semen). Natural planning methods  Natural family planning. This is not having sexual intercourse or using a barrier method (condom, diaphragm, cervical cap) on days the woman could become pregnant.  Calendar method. This is keeping track of the length of each menstrual cycle and identifying when you are fertile.  Ovulation method. This is avoiding sexual intercourse during ovulation.  Symptothermal method.  This is avoiding sexual intercourse during ovulation, using a thermometer and ovulation symptoms.  Post-ovulation method. This is timing sexual intercourse after you have ovulated. Regardless of which type or method of contraception you choose, it is important that you use condoms to protect against the transmission of sexually transmitted infections (STIs). Talk with your health care provider about which form of contraception is most appropriate for you. This information is not intended to replace advice given to you by your health care provider. Make sure you discuss any questions you have with your health care provider. Document Released: 12/07/2005 Document Revised: 05/14/2016 Document Reviewed: 06/01/2013 Elsevier Interactive Patient Education  2017 [employer]  

## 2017-09-02 ENCOUNTER — Other Ambulatory Visit (HOSPITAL_COMMUNITY)
Admission: RE | Admit: 2017-09-02 | Discharge: 2017-09-02 | Disposition: A | Discharge status: Home / Self Care | Payer: BLUE CROSS/BLUE SHIELD | Source: Ambulatory Visit | Attending: Obstetrics and Gynecology | Admitting: Obstetrics and Gynecology

## 2017-09-02 ENCOUNTER — Ambulatory Visit (INDEPENDENT_AMBULATORY_CARE_PROVIDER_SITE_OTHER): Payer: BLUE CROSS/BLUE SHIELD | Admitting: Obstetrics and Gynecology

## 2017-09-02 ENCOUNTER — Other Ambulatory Visit: Payer: BLUE CROSS/BLUE SHIELD

## 2017-09-02 VITALS — BP 104/73 | HR 88 | Wt 139.8 lb

## 2017-09-02 DIAGNOSIS — O0932 Supervision of pregnancy with insufficient antenatal care, second trimester: Secondary | ICD-10-CM

## 2017-09-02 DIAGNOSIS — Z113 Encounter for screening for infections with a predominantly sexual mode of transmission: Secondary | ICD-10-CM

## 2017-09-02 DIAGNOSIS — O09292 Supervision of pregnancy with other poor reproductive or obstetric history, second trimester: Secondary | ICD-10-CM | POA: Diagnosis present

## 2017-09-02 DIAGNOSIS — Z23 Encounter for immunization: Secondary | ICD-10-CM | POA: Diagnosis not present

## 2017-09-02 DIAGNOSIS — O0930 Supervision of pregnancy with insufficient antenatal care, unspecified trimester: Secondary | ICD-10-CM

## 2017-09-02 DIAGNOSIS — O99820 Streptococcus B carrier state complicating pregnancy: Secondary | ICD-10-CM

## 2017-09-02 NOTE — Progress Notes (Signed)
ROB/GTT. TDAP and FLU vaccines given.  Patient complains of constipation.
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
## 2017-09-03 LAB — CBC
HEMOGLOBIN: 8.6 g/dL — AB (ref 11.1–15.9)
Hematocrit: 28.8 % — ABNORMAL LOW (ref 34.0–46.6)
MCH: 20.4 pg — ABNORMAL LOW (ref 26.6–33.0)
MCHC: 29.9 g/dL — AB (ref 31.5–35.7)
MCV: 68 fL — ABNORMAL LOW (ref 79–97)
PLATELETS: 299 10*3/uL (ref 150–379)
RBC: 4.21 x10E6/uL (ref 3.77–5.28)
RDW: 17.6 % — ABNORMAL HIGH (ref 12.3–15.4)
WBC: 13.2 10*3/uL — ABNORMAL HIGH (ref 3.4–10.8)

## 2017-09-03 LAB — GLUCOSE TOLERANCE, 2 HOURS W/ 1HR
GLUCOSE, 1 HOUR: 103 mg/dL (ref 65–179)
Glucose, 2 hour: 101 mg/dL (ref 65–152)
Glucose, Fasting: 71 mg/dL (ref 65–91)

## 2017-09-03 LAB — HIV ANTIBODY (ROUTINE TESTING W REFLEX): HIV Screen 4th Generation wRfx: NONREACTIVE

## 2017-09-03 LAB — CERVICOVAGINAL ANCILLARY ONLY
CHLAMYDIA, DNA PROBE: NEGATIVE
NEISSERIA GONORRHEA: NEGATIVE

## 2017-09-03 LAB — RPR: RPR: NONREACTIVE

## 2017-09-16 ENCOUNTER — Ambulatory Visit (INDEPENDENT_AMBULATORY_CARE_PROVIDER_SITE_OTHER): Payer: BLUE CROSS/BLUE SHIELD | Admitting: Obstetrics & Gynecology

## 2017-09-16 VITALS — BP 114/73 | HR 98 | Wt 142.6 lb

## 2017-09-16 DIAGNOSIS — O09292 Supervision of pregnancy with other poor reproductive or obstetric history, second trimester: Secondary | ICD-10-CM

## 2017-09-16 DIAGNOSIS — O99820 Streptococcus B carrier state complicating pregnancy: Secondary | ICD-10-CM
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
## 2017-09-30 ENCOUNTER — Ambulatory Visit (INDEPENDENT_AMBULATORY_CARE_PROVIDER_SITE_OTHER): Payer: BLUE CROSS/BLUE SHIELD | Admitting: Obstetrics and Gynecology

## 2017-09-30 VITALS — BP 122/87 | HR 114 | Wt 146.0 lb

## 2017-09-30 DIAGNOSIS — O99013 Anemia complicating pregnancy, third trimester: Secondary | ICD-10-CM

## 2017-09-30 DIAGNOSIS — O99019 Anemia complicating pregnancy, unspecified trimester: Secondary | ICD-10-CM | POA: Insufficient documentation

## 2017-09-30 DIAGNOSIS — O09292 Supervision of pregnancy with other poor reproductive or obstetric history, second trimester: Secondary | ICD-10-CM

## 2017-09-30 DIAGNOSIS — O99820 Streptococcus B carrier state complicating pregnancy: Secondary | ICD-10-CM

## 2017-09-30 DIAGNOSIS — D649 Anemia, unspecified: Secondary | ICD-10-CM

## 2017-09-30 DIAGNOSIS — O09293 Supervision of pregnancy with other poor reproductive or obstetric history, third trimester: Secondary | ICD-10-CM

## 2017-09-30 MED ORDER — FERROUS SULFATE 325 (65 FE) MG PO TABS: 325.0000 mg | ORAL_TABLET | Freq: Two times a day (BID) | ORAL | 1 refills | Status: AC
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
## 2017-09-30 NOTE — Progress Notes (Signed)
Patient reports good fetal movement, denies pain. 

## 2017-10-14 ENCOUNTER — Ambulatory Visit (INDEPENDENT_AMBULATORY_CARE_PROVIDER_SITE_OTHER): Payer: Medicaid Other | Admitting: Obstetrics and Gynecology

## 2017-10-14 ENCOUNTER — Other Ambulatory Visit (HOSPITAL_COMMUNITY)
Admission: RE | Admit: 2017-10-14 | Discharge: 2017-10-14 | Disposition: A | Discharge status: Home / Self Care | Payer: Medicaid Other | Source: Ambulatory Visit | Attending: Obstetrics and Gynecology | Admitting: Obstetrics and Gynecology

## 2017-10-14 VITALS — BP 120/71 | HR 92 | Wt 152.8 lb

## 2017-10-14 DIAGNOSIS — Z113 Encounter for screening for infections with a predominantly sexual mode of transmission: Secondary | ICD-10-CM

## 2017-10-14 DIAGNOSIS — O09292 Supervision of pregnancy with other poor reproductive or obstetric history, second trimester: Secondary | ICD-10-CM

## 2017-10-14 DIAGNOSIS — O99820 Streptococcus B carrier state complicating pregnancy: Secondary | ICD-10-CM

## 2017-10-14 NOTE — Progress Notes (Signed)
+  GBS Urine
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
## 2017-10-15 LAB — GC/CHLAMYDIA PROBE AMP (~~LOC~~) NOT AT ARMC
Chlamydia: NEGATIVE
Neisseria Gonorrhea: NEGATIVE

## 2017-10-21 ENCOUNTER — Encounter (HOSPITAL_COMMUNITY): Payer: Self-pay | Admitting: *Deleted

## 2017-10-21 ENCOUNTER — Ambulatory Visit (INDEPENDENT_AMBULATORY_CARE_PROVIDER_SITE_OTHER): Payer: Medicaid Other | Admitting: Obstetrics and Gynecology

## 2017-10-21 ENCOUNTER — Inpatient Hospital Stay (HOSPITAL_COMMUNITY)
Admission: AD | Admit: 2017-10-21 | Discharge: 2017-10-21 | Disposition: A | Discharge status: Home / Self Care | Payer: Medicaid Other | Source: Ambulatory Visit | Attending: Obstetrics and Gynecology | Admitting: Obstetrics and Gynecology

## 2017-10-21 DIAGNOSIS — Z3A36 36 weeks gestation of pregnancy: Secondary | ICD-10-CM | POA: Diagnosis not present

## 2017-10-21 DIAGNOSIS — Z87891 Personal history of nicotine dependence: Secondary | ICD-10-CM | POA: Diagnosis not present

## 2017-10-21 DIAGNOSIS — O479 False labor, unspecified: Secondary | ICD-10-CM | POA: Diagnosis not present

## 2017-10-21 DIAGNOSIS — O0930 Supervision of pregnancy with insufficient antenatal care, unspecified trimester: Secondary | ICD-10-CM

## 2017-10-21 DIAGNOSIS — M549 Dorsalgia, unspecified: Secondary | ICD-10-CM | POA: Diagnosis present

## 2017-10-21 DIAGNOSIS — R7989 Other specified abnormal findings of blood chemistry: Secondary | ICD-10-CM

## 2017-10-21 DIAGNOSIS — O99820 Streptococcus B carrier state complicating pregnancy: Secondary | ICD-10-CM

## 2017-10-21 DIAGNOSIS — N898 Other specified noninflammatory disorders of vagina: Secondary | ICD-10-CM | POA: Insufficient documentation

## 2017-10-21 DIAGNOSIS — O26893 Other specified pregnancy related conditions, third trimester: Secondary | ICD-10-CM | POA: Diagnosis not present

## 2017-10-21 DIAGNOSIS — T7491XS Unspecified adult maltreatment, confirmed, sequela: Secondary | ICD-10-CM

## 2017-10-21 DIAGNOSIS — O09292 Supervision of pregnancy with other poor reproductive or obstetric history, second trimester: Secondary | ICD-10-CM

## 2017-10-21 NOTE — Patient Instructions (Addendum)
Third Trimester of Pregnancy The third trimester is from week 28 through week 40 (months 7 through 9). The third trimester is a time when the unborn baby (fetus) is growing rapidly. At the end of the ninth month, the fetus is about 20 inches in length and weighs 6-10 pounds. Body changes during your third trimester Your body will continue to go through many changes during pregnancy. The changes vary from woman to woman. During the third trimester:  Your weight will continue to increase. You can expect to gain 25-35 pounds (11-16 kg) by the end of the pregnancy.  You may begin to get stretch marks on your hips, abdomen, and breasts.  You may urinate more often because the fetus is moving lower into your pelvis and pressing on your bladder.  You may develop or continue to have heartburn. This is caused by increased hormones that slow down muscles in the digestive tract.  You may develop or continue to have constipation because increased hormones slow digestion and cause the muscles that push waste through your intestines to relax.  You may develop hemorrhoids. These are swollen veins (varicose veins) in the rectum that can itch or be painful.  You may develop swollen, bulging veins (varicose veins) in your legs.  You may have increased body aches in the pelvis, back, or thighs. This is due to weight gain and increased hormones that are relaxing your joints.  You may have changes in your hair. These can include thickening of your hair, rapid growth, and changes in texture. Some women also have hair loss during or after pregnancy, or hair that feels dry or thin. Your hair will most likely return to normal after your baby is born.  Your breasts will continue to grow and they will continue to become tender. A yellow fluid (colostrum) may leak from your breasts. This is the first milk you are producing for your baby.  Your belly button may stick out.  You may notice more swelling in your hands,  face, or ankles.  You may have increased tingling or numbness in your hands, arms, and legs. The skin on your belly may also feel numb.  You may feel short of breath because of your expanding uterus.  You may have more problems sleeping. This can be caused by the size of your belly, increased need to urinate, and an increase in your body's metabolism.  You may notice the fetus "dropping," or moving lower in your abdomen (lightening).  You may have increased vaginal discharge.  You may notice your joints feel loose and you may have pain around your pelvic bone.  What to expect at prenatal visits You will have prenatal exams every 2 weeks until week 36. Then you will have weekly prenatal exams. During a routine prenatal visit:  You will be weighed to make sure you and the baby are growing normally.  Your blood pressure will be taken.  Your abdomen will be measured to track your baby's growth.  The fetal heartbeat will be listened to.  Any test results from the previous visit will be discussed.  You may have a cervical check near your due date to see if your cervix has softened or thinned (effaced).  You will be tested for Group B streptococcus. This happens between 35 and 37 weeks.  Your health care provider may ask you:  What your birth plan is.  How you are feeling.  If you are feeling the baby move.  If you have had   any abnormal symptoms, such as leaking fluid, bleeding, severe headaches, or abdominal cramping.  If you are using any tobacco products, including cigarettes, chewing tobacco, and electronic cigarettes.  If you have any questions.  Other tests or screenings that may be performed during your third trimester include:  Blood tests that check for low iron levels (anemia).  Fetal testing to check the health, activity level, and growth of the fetus. Testing is done if you have certain medical conditions or if there are problems during the  pregnancy.  Nonstress test (NST). This test checks the health of your baby to make sure there are no signs of problems, such as the baby not getting enough oxygen. During this test, a belt is placed around your belly. The baby is made to move, and its heart rate is monitored during movement.  What is false labor? False labor is a condition in which you feel small, irregular tightenings of the muscles in the womb (contractions) that usually go away with rest, changing position, or drinking water. These are called Braxton Hicks contractions. Contractions may last for hours, days, or even weeks before true labor sets in. If contractions come at regular intervals, become more frequent, increase in intensity, or become painful, you should see your health care provider. What are the signs of labor?  Abdominal cramps.  Regular contractions that start at 10 minutes apart and become stronger and more frequent with time.  Contractions that start on the top of the uterus and spread down to the lower abdomen and back.  Increased pelvic pressure and dull back pain.  A watery or bloody mucus discharge that comes from the vagina.  Leaking of amniotic fluid. This is also known as your "water breaking." It could be a slow trickle or a gush. Let your health care provider know if it has a color or strange odor. If you have any of these signs, call your health care provider right away, even if it is before your due date. Follow these instructions at home: Medicines  Follow your health care provider's instructions regarding medicine use. Specific medicines may be either safe or unsafe to take during pregnancy.  Take a prenatal vitamin that contains at least 600 micrograms (mcg) of folic acid.  If you develop constipation, try taking a stool softener if your health care provider approves. Eating and drinking  Eat a balanced diet that includes fresh fruits and vegetables, whole grains, good sources of protein  such as meat, eggs, or tofu, and low-fat dairy. Your health care provider will help you determine the amount of weight gain that is right for you.  Avoid raw meat and uncooked cheese. These carry germs that can cause birth defects in the baby.  If you have low calcium intake from food, talk to your health care provider about whether you should take a daily calcium supplement.  Eat four or five small meals rather than three large meals a day.  Limit foods that are high in fat and processed sugars, such as fried and sweet foods.  To prevent constipation: ? Drink enough fluid to keep your urine clear or pale yellow. ? Eat foods that are high in fiber, such as fresh fruits and vegetables, whole grains, and beans. Activity  Exercise only as directed by your health care provider. Most women can continue their usual exercise routine during pregnancy. Try to exercise for 30 minutes at least 5 days a week. Stop exercising if you experience uterine contractions.  Avoid heavy   lifting.  Do not exercise in extreme heat or humidity, or at high altitudes.  Wear low-heel, comfortable shoes.  Practice good posture.  You may continue to have sex unless your health care provider tells you otherwise. Relieving pain and discomfort  Take frequent breaks and rest with your legs elevated if you have leg cramps or low back pain.  Take warm sitz baths to soothe any pain or discomfort caused by hemorrhoids. Use hemorrhoid cream if your health care provider approves.  Wear a good support bra to prevent discomfort from breast tenderness.  If you develop varicose veins: ? Wear support pantyhose or compression stockings as told by your healthcare provider. ? Elevate your feet for 15 minutes, 3-4 times a day. Prenatal care  Write down your questions. Take them to your prenatal visits.  Keep all your prenatal visits as told by your health care provider. This is important. Safety  Wear your seat belt at  all times when driving.  Make a list of emergency phone numbers, including numbers for family, friends, the hospital, and police and fire departments. General instructions  Avoid cat litter boxes and soil used by cats. These carry germs that can cause birth defects in the baby. If you have a cat, ask someone to clean the litter box for you.  Do not travel far distances unless it is absolutely necessary and only with the approval of your health care provider.  Do not use hot tubs, steam rooms, or saunas.  Do not drink alcohol.  Do not use any products that contain nicotine or tobacco, such as cigarettes and e-cigarettes. If you need help quitting, ask your health care provider.  Do not use any medicinal herbs or unprescribed drugs. These chemicals affect the formation and growth of the baby.  Do not douche or use tampons or scented sanitary pads.  Do not cross your legs for long periods of time.  To prepare for the arrival of your baby: ? Take prenatal classes to understand, practice, and ask questions about labor and delivery. ? Make a trial run to the hospital. ? Visit the hospital and tour the maternity area. ? Arrange for maternity or paternity leave through employers. ? Arrange for family and friends to take care of pets while you are in the hospital. ? Purchase a rear-facing car seat and make sure you know how to install it in your car. ? Pack your hospital bag. ? Prepare the baby's nursery. Make sure to remove all pillows and stuffed animals from the baby's crib to prevent suffocation.  Visit your dentist if you have not gone during your pregnancy. Use a soft toothbrush to brush your teeth and be gentle when you floss. Contact a health care provider if:  You are unsure if you are in labor or if your water has broken.  You become dizzy.  You have mild pelvic cramps, pelvic pressure, or nagging pain in your abdominal area.  You have lower back pain.  You have persistent  nausea, vomiting, or diarrhea.  You have an unusual or bad smelling vaginal discharge.  You have pain when you urinate. Get help right away if:  Your water breaks before 37 weeks.  You have regular contractions less than 5 minutes apart before 37 weeks.  You have a fever.  You are leaking fluid from your vagina.  You have spotting or bleeding from your vagina.  You have severe abdominal pain or cramping.  You have rapid weight loss or weight gain.    You have shortness of breath with chest pain.  You notice sudden or extreme swelling of your face, hands, ankles, feet, or legs.  Your baby makes fewer than 10 movements in 2 hours.  You have severe headaches that do not go away when you take medicine.  You have vision changes. Summary  The third trimester is from week 28 through week 40, months 7 through 9. The third trimester is a time when the unborn baby (fetus) is growing rapidly.  During the third trimester, your discomfort may increase as you and your baby continue to gain weight. You may have abdominal, leg, and back pain, sleeping problems, and an increased need to urinate.  During the third trimester your breasts will keep growing and they will continue to become tender. A yellow fluid (colostrum) may leak from your breasts. This is the first milk you are producing for your baby.  False labor is a condition in which you feel small, irregular tightenings of the muscles in the womb (contractions) that eventually go away. These are called Braxton Hicks contractions. Contractions may last for hours, days, or even weeks before true labor sets in.  Signs of labor can include: abdominal cramps; regular contractions that start at 10 minutes apart and become stronger and more frequent with time; watery or bloody mucus discharge that comes from the vagina; increased pelvic pressure and dull back pain; and leaking of amniotic fluid. This information is not intended to replace advice  given to you by your health care provider. Make sure you discuss any questions you have with your health care provider. Document Released: 12/01/2001 Document Revised: 05/14/2016 Document Reviewed: 02/07/2013 [employer] Interactive Patient Education  2017 [employer] Contraception Choices Contraception (birth control) is the use of any methods or devices to prevent pregnancy. Below are some methods to help avoid pregnancy. Hormonal methods  Contraceptive implant. This is a thin, plastic tube containing progesterone hormone. It does not contain estrogen hormone. Your health care provider inserts the tube in the inner part of the upper arm. The tube can remain in place for up to 3 years. After 3 years, the implant must be removed. The implant prevents the ovaries from releasing an egg (ovulation), thickens the cervical mucus to prevent sperm from entering the uterus, and thins the lining of the inside of the uterus.  Progesterone-only injections. These injections are given every 3 months by your health care provider to prevent pregnancy. This synthetic progesterone hormone stops the ovaries from releasing eggs. It also thickens cervical mucus and changes the uterine lining. This makes it harder for sperm to survive in the uterus.  Birth control pills. These pills contain estrogen and progesterone hormone. They work by preventing the ovaries from releasing eggs (ovulation). They also cause the cervical mucus to thicken, preventing the sperm from entering the uterus. Birth control pills are prescribed by a health care provider.Birth control pills can also be used to treat heavy periods.  Minipill. This type of birth control pill contains only the progesterone hormone. They are taken every day of each month and must be prescribed by your health care provider.  Birth control patch. The patch contains hormones similar to those in birth control pills. It must be changed once a week and is prescribed by a  health care provider.  Vaginal ring. The ring contains hormones similar to those in birth control pills. It is left in the vagina for 3 weeks, removed for 1 week, and then a new one is put   back in place. The patient must be comfortable inserting and removing the ring from the vagina.A health care provider's prescription is necessary.  Emergency contraception. Emergency contraceptives prevent pregnancy after unprotected sexual intercourse. This pill can be taken right after sex or up to 5 days after unprotected sex. It is most effective the sooner you take the pills after having sexual intercourse. Most emergency contraceptive pills are available without a prescription. Check with your pharmacist. Do not use emergency contraception as your only form of birth control. Barrier methods  Female condom. This is a thin sheath (latex or rubber) that is worn over the penis during sexual intercourse. It can be used with spermicide to increase effectiveness.  Female condom. This is a soft, loose-fitting sheath that is put into the vagina before sexual intercourse.  Diaphragm. This is a soft, latex, dome-shaped barrier that must be fitted by a health care provider. It is inserted into the vagina, along with a spermicidal jelly. It is inserted before intercourse. The diaphragm should be left in the vagina for 6 to 8 hours after intercourse.  Cervical cap. This is a round, soft, latex or plastic cup that fits over the cervix and must be fitted by a health care provider. The cap can be left in place for up to 48 hours after intercourse.  Sponge. This is a soft, circular piece of polyurethane foam. The sponge has spermicide in it. It is inserted into the vagina after wetting it and before sexual intercourse.  Spermicides. These are chemicals that kill or block sperm from entering the cervix and uterus. They come in the form of creams, jellies, suppositories, foam, or tablets. They do not require a prescription. They  are inserted into the vagina with an applicator before having sexual intercourse. The process must be repeated every time you have sexual intercourse. Intrauterine contraception  Intrauterine device (IUD). This is a T-shaped device that is put in a woman's uterus during a menstrual period to prevent pregnancy. There are 2 types: ? Copper IUD. This type of IUD is wrapped in copper wire and is placed inside the uterus. Copper makes the uterus and fallopian tubes produce a fluid that kills sperm. It can stay in place for 10 years. ? Hormone IUD. This type of IUD contains the hormone progestin (synthetic progesterone). The hormone thickens the cervical mucus and prevents sperm from entering the uterus, and it also thins the uterine lining to prevent implantation of a fertilized egg. The hormone can weaken or kill the sperm that get into the uterus. It can stay in place for 3-5 years, depending on which type of IUD is used. Permanent methods of contraception  Female tubal ligation. This is when the woman's fallopian tubes are surgically sealed, tied, or blocked to prevent the egg from traveling to the uterus.  Hysteroscopic sterilization. This involves placing a small coil or insert into each fallopian tube. Your doctor uses a technique called hysteroscopy to do the procedure. The device causes scar tissue to form. This results in permanent blockage of the fallopian tubes, so the sperm cannot fertilize the egg. It takes about 3 months after the procedure for the tubes to become blocked. You must use another form of birth control for these 3 months.  Female sterilization. This is when the female has the tubes that carry sperm tied off (vasectomy).This blocks sperm from entering the vagina during sexual intercourse. After the procedure, the man can still ejaculate fluid (semen). Natural planning methods  Natural family planning.   This is not having sexual intercourse or using a barrier method (condom, diaphragm,  cervical cap) on days the woman could become pregnant.  Calendar method. This is keeping track of the length of each menstrual cycle and identifying when you are fertile.  Ovulation method. This is avoiding sexual intercourse during ovulation.  Symptothermal method. This is avoiding sexual intercourse during ovulation, using a thermometer and ovulation symptoms.  Post-ovulation method. This is timing sexual intercourse after you have ovulated. Regardless of which type or method of contraception you choose, it is important that you use condoms to protect against the transmission of sexually transmitted infections (STIs). Talk with your health care provider about which form of contraception is most appropriate for you. This information is not intended to replace advice given to you by your health care provider. Make sure you discuss any questions you have with your health care provider. Document Released: 12/07/2005 Document Revised: 05/14/2016 Document Reviewed: 06/01/2013 [employer] Interactive Patient Education  2017 [employer]  

## 2017-10-21 NOTE — Progress Notes (Signed)
Pt complains of pelvic cramping and low back pain.

## 2017-10-21 NOTE — MAU Note (Signed)
Pt states she sat on the toilet and had a lot of clear fluid come out. States it did not seem to be urine. Pt states she has not had anymore come out. Pt reports lower back pain. Pt denies vaginal bleeding. Reports good fetal movement.

## 2017-10-21 NOTE — Addendum Note (Signed)
Addended by: Natale MilchSTALLING, BRITTANY D on: 10/21/2017 03:41 PM   Modules accepted: Orders

## 2017-10-21 NOTE — MAU Provider Note (Signed)
Chief Complaint:  Rupture of Membranes   First Provider Initiated Contact with Patient 10/21/17 2223     HPI: Hannah Sosa is a 21 y.o. G2P0100 at 64w2dwho presents to maternity admissions reporting leaking fluid and some low back pain with contractions.  States it was a one time flow of water on toilet, then no more leaking after that.    I was asked to rule out ruptured membranes. She reports good fetal movement, denies vaginal bleeding, vaginal itching/burning, urinary symptoms, h/a, dizziness, n/v, diarrhea, constipation or fever/chills.    Vaginal Discharge  The patient's primary symptoms include pelvic pain and vaginal discharge. The patient's pertinent negatives include no genital itching, genital lesions, genital odor or vaginal bleeding. This is a new problem. The current episode started today. The problem occurs rarely. The problem has been resolved. The pain is mild. She is pregnant. Associated symptoms include abdominal pain and back pain. Pertinent negatives include no chills, constipation, diarrhea, fever, nausea or vomiting. The vaginal discharge was watery and clear. There has been no bleeding. She has not been passing clots. She has not been passing tissue. Nothing aggravates the symptoms. She has tried nothing for the symptoms.   RN Note: Pt states she sat on the toilet and had a lot of clear fluid come out. States it did not seem to be urine. Pt states she has not had anymore come out. Pt reports lower back pain. Pt denies vaginal bleeding. Reports good fetal movement.   Past Medical History: Past Medical History:  Diagnosis Date  . Chlamydia   . Medical history non-contributory     Past obstetric history: OB History  Gravida Para Term Preterm AB Living  2 1   1    0  SAB TAB Ectopic Multiple Live Births        0 0    # Outcome Date GA Lbr Len/2nd Weight Sex Delivery Anes PTL Lv  2 Current           1 Preterm 12/26/15 [redacted]w[redacted]d 03:30 / 00:08 13.7 oz (0.388 kg) M Vag-Breech  None  FD      Past Surgical History: Past Surgical History:  Procedure Laterality Date  . NO PAST SURGERIES      Family History: Family History  Problem Relation Age of Onset  . Cancer Neg Hx   . Diabetes Neg Hx   . Hypertension Neg Hx     Social History: Social History  Substance Use Topics  . Smoking status: Former Smoker    Quit date: 12/2016  . Smokeless tobacco: Never Used  . Alcohol use No    Allergies: No Known Allergies  Meds:  Prescriptions Prior to Admission  Medication Sig Dispense Refill Last Dose  . acetaminophen (TYLENOL) 500 MG tablet Take 500 mg by mouth every 6 (six) hours as needed for mild pain or headache.   Not Taking  . ferrous sulfate (FERROUSUL) 325 (65 FE) MG tablet Take 1 tablet (325 mg total) by mouth 2 (two) times daily. 60 tablet 1   . Prenat-FeAsp-Meth-FA-DHA w/o A (PRENATE PIXIE) 10-0.6-0.4-200 MG CAPS Take 1 tablet by mouth daily. 30 capsule 12 Taking  . Prenatal MV & Min w/FA-DHA (PRENATAL ADULT GUMMY/DHA/FA PO) Take 2 each by mouth daily.   Not Taking  . Vitamin D, Ergocalciferol, (DRISDOL) 50000 units CAPS capsule Take 1 capsule (50,000 Units total) by mouth every 7 (seven) days. 30 capsule 2 Taking    I have reviewed patient's Past Medical Hx, Surgical Hx,  Family Hx, Social Hx, medications and allergies.   ROS:  Review of Systems  Constitutional: Negative for chills and fever.  Gastrointestinal: Positive for abdominal pain. Negative for constipation, diarrhea, nausea and vomiting.  Genitourinary: Positive for pelvic pain and vaginal discharge.  Musculoskeletal: Positive for back pain.   Other systems negative  Physical Exam  Patient Vitals for the past 24 hrs:  BP Temp Temp src Pulse Resp SpO2 Height Weight  10/21/17 2120 110/68 98.3 F (36.8 C) Oral 96 18 99 % 5' 2.5" (1.588 m) 160 lb (72.6 kg)   Constitutional: Well-developed, well-nourished female in no acute distress.  Cardiovascular: normal rate and rhythm Respiratory:  normal effort, clear to auscultation bilaterally GI: Abd soft, non-tender, gravid appropriate for gestational age.   No rebound or guarding. MS: Extremities nontender, no edema, normal ROM Neurologic: Alert and oriented x 4.  GU: Neg CVAT.  PELVIC EXAM: Cervix pink, visually closed, without lesion, scant white creamy discharge, vaginal walls and external genitalia normal Bimanual exam: Cervix firm, posterior, neg CMT, uterus nontender, Fundal Height consistent with dates, adnexa without tenderness, enlargement, or mass   FHT:  Baseline 135 , moderate variability, accelerations present, no decelerations Contractions:  Irregular    Labs: No results found for this or any previous visit (from the past 24 hour(s)).  O/Positive/-- (07/20 1141)  Imaging:  No results found.  MAU Course/MDM: NST reviewed and found to be Category I, reactive.  Treatments in MAU included EFM, speculum exam.    Assessment: Single IUP at 653w3d Leakage of fluid, likely urine Reactive Nonstress test Not in labor   Plan: Discharge home Labor precautions and fetal kick counts Follow up in Office for prenatal visits and recheck  Encouraged to return here or to other Urgent Care/ED if she develops worsening of symptoms, increase in pain, fever, or other concerning symptoms.   Pt stable at time of discharge.  Wynelle BourgeoisMarie Jerrard Bradburn CNM, MSN Certified Nurse-Midwife 10/21/2017 10:23 PM

## 2017-10-21 NOTE — Discharge Instructions (Signed)
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
## 2017-10-28 ENCOUNTER — Other Ambulatory Visit: Payer: Self-pay

## 2017-10-28 ENCOUNTER — Ambulatory Visit (INDEPENDENT_AMBULATORY_CARE_PROVIDER_SITE_OTHER): Payer: Medicaid Other | Admitting: Obstetrics

## 2017-10-28 ENCOUNTER — Encounter: Payer: Self-pay | Admitting: Obstetrics

## 2017-10-28 VITALS — BP 120/76 | HR 92 | Wt 162.6 lb

## 2017-10-28 DIAGNOSIS — T7491XS Unspecified adult maltreatment, confirmed, sequela: Secondary | ICD-10-CM

## 2017-10-28 DIAGNOSIS — O98812 Other maternal infectious and parasitic diseases complicating pregnancy, second trimester: Secondary | ICD-10-CM

## 2017-10-28 DIAGNOSIS — O09292 Supervision of pregnancy with other poor reproductive or obstetric history, second trimester: Secondary | ICD-10-CM

## 2017-10-28 DIAGNOSIS — O0932 Supervision of pregnancy with insufficient antenatal care, second trimester: Secondary | ICD-10-CM

## 2017-10-28 DIAGNOSIS — A749 Chlamydial infection, unspecified: Secondary | ICD-10-CM

## 2017-10-28 DIAGNOSIS — O99820 Streptococcus B carrier state complicating pregnancy: Secondary | ICD-10-CM

## 2017-10-28 DIAGNOSIS — O0930 Supervision of pregnancy with insufficient antenatal care, unspecified trimester: Secondary | ICD-10-CM
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
## 2017-10-29 ENCOUNTER — Ambulatory Visit (HOSPITAL_COMMUNITY)
Admission: RE | Admit: 2017-10-29 | Discharge: 2017-10-29 | Disposition: A | Discharge status: Home / Self Care | Payer: Medicaid Other | Source: Ambulatory Visit | Attending: Obstetrics and Gynecology | Admitting: Obstetrics and Gynecology

## 2017-10-29 DIAGNOSIS — Z3A37 37 weeks gestation of pregnancy: Secondary | ICD-10-CM | POA: Diagnosis not present

## 2017-10-29 DIAGNOSIS — O09292 Supervision of pregnancy with other poor reproductive or obstetric history, second trimester: Secondary | ICD-10-CM | POA: Diagnosis not present

## 2017-11-03 ENCOUNTER — Other Ambulatory Visit: Payer: Self-pay

## 2017-11-03 ENCOUNTER — Encounter: Payer: Self-pay | Admitting: Certified Nurse Midwife

## 2017-11-03 ENCOUNTER — Ambulatory Visit (INDEPENDENT_AMBULATORY_CARE_PROVIDER_SITE_OTHER): Payer: Medicaid Other | Admitting: Certified Nurse Midwife

## 2017-11-03 DIAGNOSIS — O09293 Supervision of pregnancy with other poor reproductive or obstetric history, third trimester: Secondary | ICD-10-CM

## 2017-11-03 DIAGNOSIS — O0933 Supervision of pregnancy with insufficient antenatal care, third trimester: Secondary | ICD-10-CM

## 2017-11-03 DIAGNOSIS — O0930 Supervision of pregnancy with insufficient antenatal care, unspecified trimester: Secondary | ICD-10-CM

## 2017-11-03 DIAGNOSIS — R7989 Other specified abnormal findings of blood chemistry: Secondary | ICD-10-CM

## 2017-11-03 DIAGNOSIS — O99820 Streptococcus B carrier state complicating pregnancy: Secondary | ICD-10-CM

## 2017-11-03 DIAGNOSIS — O09292 Supervision of pregnancy with other poor reproductive or obstetric history, second trimester: Secondary | ICD-10-CM

## 2017-11-03 NOTE — Progress Notes (Signed)
ROB, complains of right thigh pain x 2 days 8/10 Lower back and abdominal pain  X 3 days 7/10
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
## 2017-11-10 ENCOUNTER — Ambulatory Visit (INDEPENDENT_AMBULATORY_CARE_PROVIDER_SITE_OTHER): Payer: Medicaid Other | Admitting: Nurse Practitioner

## 2017-11-10 DIAGNOSIS — Z3483 Encounter for supervision of other normal pregnancy, third trimester: Secondary | ICD-10-CM

## 2017-11-10 DIAGNOSIS — O09292 Supervision of pregnancy with other poor reproductive or obstetric history, second trimester: Secondary | ICD-10-CM

## 2017-11-10 DIAGNOSIS — O09293 Supervision of pregnancy with other poor reproductive or obstetric history, third trimester: Secondary | ICD-10-CM
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
## 2017-11-10 NOTE — Patient Instructions (Addendum)
Return in one week.  Braxton Hicks Contractions Contractions of the uterus can occur throughout pregnancy, but they are not always a sign that you are in labor. You may have practice contractions called Braxton Hicks contractions. These false labor contractions are sometimes confused with true labor. What are Braxton Hicks contractions? Braxton Hicks contractions are tightening movements that occur in the muscles of the uterus before labor. Unlike true labor contractions, these contractions do not result in opening (dilation) and thinning of the cervix. Toward the end of pregnancy (32-34 weeks), Braxton Hicks contractions can happen more often and may become stronger. These contractions are sometimes difficult to tell apart from true labor because they can be very uncomfortable. You should not feel embarrassed if you go to the hospital with false labor. Sometimes, the only way to tell if you are in true labor is for your health care provider to look for changes in the cervix. The health care provider will do a physical exam and may monitor your contractions. If you are not in true labor, the exam should show that your cervix is not dilating and your water has not broken. If there are no prenatal problems or other health problems associated with your pregnancy, it is completely safe for you to be sent home with false labor. You may continue to have Braxton Hicks contractions until you go into true labor. How can I tell the difference between true labor and false labor?  Differences ? False labor ? Contractions last 30-70 seconds.: Contractions are usually shorter and not as strong as true labor contractions. ? Contractions become very regular.: Contractions are usually irregular. ? Discomfort is usually felt in the top of the uterus, and it spreads to the lower abdomen and low back.: Contractions are often felt in the front of the lower abdomen and in the groin. ? Contractions do not go away with  walking.: Contractions may go away when you walk around or change positions while lying down. ? Contractions usually become more intense and increase in frequency.: Contractions get weaker and are shorter-lasting as time goes on. ? The cervix dilates and gets thinner.: The cervix usually does not dilate or become thin. Follow these instructions at home:  Take over-the-counter and prescription medicines only as told by your health care provider.  Keep up with your usual exercises and follow other instructions from your health care provider.  Eat and drink lightly if you think you are going into labor.  If Braxton Hicks contractions are making you uncomfortable: ? Change your position from lying down or resting to walking, or change from walking to resting. ? Sit and rest in a tub of warm water. ? Drink enough fluid to keep your urine clear or pale yellow. Dehydration may cause these contractions. ? Do slow and deep breathing several times an hour.  Keep all follow-up prenatal visits as told by your health care provider. This is important. Contact a health care provider if:  You have a fever.  You have continuous pain in your abdomen. Get help right away if:  Your contractions become stronger, more regular, and closer together.  You have fluid leaking or gushing from your vagina.  You pass blood-tinged mucus (bloody show).  You have bleeding from your vagina.  You have low back pain that you never had before.  You feel your baby's head pushing down and causing pelvic pressure.  Your baby is not moving inside you as much as it used to. Summary  Contractions   that occur before labor are called Braxton Hicks contractions, false labor, or practice contractions.  Braxton Hicks contractions are usually shorter, weaker, farther apart, and less regular than true labor contractions. True labor contractions usually become progressively stronger and regular and they become more  frequent.  Manage discomfort from [facility]enter[facility] Hicks contractions by changing position, resting in a warm bath, drinking plenty of water, or practicing deep breathing. This information is not intended to replace advice given to you by your health care provider. Make sure you discuss any questions you have with your health care provider. Document Released: 12/07/2005 Document Revised: 10/26/2016 Document Reviewed: 10/26/2016 Elsevier Interactive Patient Education  2017 [employer]

## 2017-11-18 ENCOUNTER — Ambulatory Visit (INDEPENDENT_AMBULATORY_CARE_PROVIDER_SITE_OTHER): Payer: Medicaid Other | Admitting: Certified Nurse Midwife

## 2017-11-18 ENCOUNTER — Inpatient Hospital Stay (HOSPITAL_COMMUNITY)
Admission: AD | Admit: 2017-11-18 | Discharge: 2017-11-21 | DRG: 807 | Disposition: A | Discharge status: Home / Self Care | Payer: Medicaid Other | Source: Ambulatory Visit | Attending: Obstetrics and Gynecology | Admitting: Obstetrics and Gynecology

## 2017-11-18 ENCOUNTER — Encounter: Payer: Self-pay | Admitting: Certified Nurse Midwife

## 2017-11-18 ENCOUNTER — Encounter (HOSPITAL_COMMUNITY): Payer: Self-pay | Admitting: Anesthesiology

## 2017-11-18 ENCOUNTER — Ambulatory Visit (HOSPITAL_COMMUNITY)
Admission: RE | Admit: 2017-11-18 | Discharge: 2017-11-18 | Disposition: A | Discharge status: Home / Self Care | Payer: Medicaid Other | Source: Ambulatory Visit | Attending: Certified Nurse Midwife | Admitting: Certified Nurse Midwife

## 2017-11-18 VITALS — BP 128/80 | HR 92 | Wt 173.6 lb

## 2017-11-18 DIAGNOSIS — O9902 Anemia complicating childbirth: Secondary | ICD-10-CM | POA: Diagnosis present

## 2017-11-18 DIAGNOSIS — O99824 Streptococcus B carrier state complicating childbirth: Secondary | ICD-10-CM | POA: Diagnosis present

## 2017-11-18 DIAGNOSIS — D649 Anemia, unspecified: Secondary | ICD-10-CM | POA: Diagnosis present

## 2017-11-18 DIAGNOSIS — Z3A40 40 weeks gestation of pregnancy: Secondary | ICD-10-CM

## 2017-11-18 DIAGNOSIS — O09292 Supervision of pregnancy with other poor reproductive or obstetric history, second trimester: Secondary | ICD-10-CM

## 2017-11-18 DIAGNOSIS — Z87891 Personal history of nicotine dependence: Secondary | ICD-10-CM

## 2017-11-18 DIAGNOSIS — O368390 Maternal care for abnormalities of the fetal heart rate or rhythm, unspecified trimester, not applicable or unspecified: Secondary | ICD-10-CM

## 2017-11-18 LAB — CBC
HCT: 30.6 % — ABNORMAL LOW (ref 36.0–46.0)
HEMOGLOBIN: 8.4 g/dL — AB (ref 12.0–15.0)
MCH: 18.1 pg — AB (ref 26.0–34.0)
MCHC: 27.5 g/dL — ABNORMAL LOW (ref 30.0–36.0)
MCV: 65.9 fL — AB (ref 78.0–100.0)
Platelets: 305 10*3/uL (ref 150–400)
RBC: 4.64 MIL/uL (ref 3.87–5.11)
RDW: 20.8 % — ABNORMAL HIGH (ref 11.5–15.5)
WBC: 10.9 10*3/uL — AB (ref 4.0–10.5)

## 2017-11-18 LAB — TYPE AND SCREEN
ABO/RH(D): O POS
Antibody Screen: NEGATIVE

## 2017-11-18 MED ORDER — ACETAMINOPHEN 325 MG PO TABS
650.0000 mg | ORAL_TABLET | ORAL | Status: DC | PRN
  Administered 2017-11-20: 650 mg via ORAL
  Filled 2017-11-18: qty 2

## 2017-11-18 MED ORDER — PENICILLIN G POTASSIUM 5000000 UNITS IJ SOLR
5.0000 10*6.[IU] | Freq: Once | INTRAVENOUS | Status: AC
  Administered 2017-11-18: 5 10*6.[IU] via INTRAVENOUS
  Filled 2017-11-18: qty 5

## 2017-11-18 MED ORDER — SOD CITRATE-CITRIC ACID 500-334 MG/5ML PO SOLN: 30.0000 mL | ORAL | Status: DC | PRN

## 2017-11-18 MED ORDER — FLEET ENEMA 7-19 GM/118ML RE ENEM: 1.0000 | ENEMA | RECTAL | Status: DC | PRN

## 2017-11-18 MED ORDER — OXYCODONE-ACETAMINOPHEN 5-325 MG PO TABS: 2.0000 | ORAL_TABLET | ORAL | Status: DC | PRN

## 2017-11-18 MED ORDER — LACTATED RINGERS IV SOLN
INTRAVENOUS | Status: DC
  Administered 2017-11-18 – 2017-11-19 (×3): via INTRAVENOUS

## 2017-11-18 MED ORDER — PENICILLIN G POTASSIUM 5000000 UNITS IJ SOLR: 3.0000 10*6.[IU] | INTRAMUSCULAR | Status: DC

## 2017-11-18 MED ORDER — OXYTOCIN BOLUS FROM INFUSION
500.0000 mL | Freq: Once | INTRAVENOUS | Status: AC
  Administered 2017-11-19: 500 mL via INTRAVENOUS

## 2017-11-18 MED ORDER — PENICILLIN G POT IN DEXTROSE 60000 UNIT/ML IV SOLN
3.0000 10*6.[IU] | INTRAVENOUS | Status: DC
  Administered 2017-11-19 (×2): 3 10*6.[IU] via INTRAVENOUS
  Filled 2017-11-18 (×4): qty 50

## 2017-11-18 MED ORDER — LIDOCAINE HCL (PF) 1 % IJ SOLN
30.0000 mL | INTRAMUSCULAR | Status: DC | PRN
  Filled 2017-11-18: qty 30

## 2017-11-18 MED ORDER — LACTATED RINGERS IV SOLN
500.0000 mL | INTRAVENOUS | Status: DC | PRN
  Administered 2017-11-19: 250 mL via INTRAVENOUS
  Administered 2017-11-19: 500 mL via INTRAVENOUS

## 2017-11-18 MED ORDER — MISOPROSTOL 50MCG HALF TABLET
50.0000 ug | ORAL_TABLET | ORAL | Status: DC
  Administered 2017-11-18: 50 ug via ORAL
  Filled 2017-11-18 (×2): qty 1

## 2017-11-18 MED ORDER — ONDANSETRON HCL 4 MG/2ML IJ SOLN: 4.0000 mg | Freq: Four times a day (QID) | INTRAMUSCULAR | Status: DC | PRN

## 2017-11-18 MED ORDER — FENTANYL CITRATE (PF) 100 MCG/2ML IJ SOLN: 100.0000 ug | INTRAMUSCULAR | Status: DC | PRN

## 2017-11-18 MED ORDER — PENICILLIN G POTASSIUM 5000000 UNITS IJ SOLR: 5.0000 10*6.[IU] | Freq: Once | INTRAMUSCULAR | Status: DC

## 2017-11-18 MED ORDER — OXYTOCIN 40 UNITS IN LACTATED RINGERS INFUSION - SIMPLE MED
2.5000 [IU]/h | INTRAVENOUS | Status: DC
  Filled 2017-11-18: qty 1000

## 2017-11-18 MED ORDER — OXYCODONE-ACETAMINOPHEN 5-325 MG PO TABS: 1.0000 | ORAL_TABLET | ORAL | Status: DC | PRN

## 2017-11-18 MED ORDER — TERBUTALINE SULFATE 1 MG/ML IJ SOLN: 0.2500 mg | Freq: Once | INTRAMUSCULAR | Status: DC | PRN
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
## 2017-11-18 NOTE — Progress Notes (Signed)
Vitals:   11/18/17 1649 11/18/17 1919  BP: 132/85 112/71  Pulse: 81 76  Resp:  16  Temp:  98.4 F (36.9 C)   FHR Cat 1.  2nd cytotec due at 2130. PT has no c/o.  WIll plan foley when cx open.

## 2017-11-18 NOTE — Progress Notes (Signed)
Pt requesting letter for court stating induction date is 11/23/17, she will be 41 weeks then.
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
## 2017-11-18 NOTE — Addendum Note (Signed)
Addended by: Marya LandryFOSTER, Pearlee Arvizu D on: 11/18/2017 02:52 PM   Modules accepted: Orders

## 2017-11-18 NOTE — Anesthesia Pain Management Evaluation Note (Signed)
  CRNA Pain Management Visit Note  Patient: Hannah Sosa, 21 y.o., female  "Hello [facility] am a [facility]. We have an anesthesia team available at all times to provide care throughout the hospital, including epidural management and anesthesia for C-section. I don't know your plan for the delivery whether it a natural birth, water birth, IV sedation, nitrous supplementation, doula or epidural, but we want to meet your pain goals."   1.Was your pain managed to your expectations on prior hospitalizations?   No prior hospitalizations  2.What is your expectation for pain management during this hospitalization?     Labor support without medications  3.How can we help you reach that goal? natural  Record the patient's initial score and the patient's pain goal.   Pain: 7  Pain Goal: 7 [facility] wants you to be able to say your pain was always managed very well.  Harvy Riera 11/18/2017

## 2017-11-19 ENCOUNTER — Inpatient Hospital Stay (HOSPITAL_COMMUNITY): Payer: Medicaid Other | Admitting: Anesthesiology

## 2017-11-19 ENCOUNTER — Other Ambulatory Visit: Payer: Self-pay

## 2017-11-19 ENCOUNTER — Inpatient Hospital Stay (HOSPITAL_COMMUNITY): Payer: Medicaid Other

## 2017-11-19 ENCOUNTER — Telehealth (HOSPITAL_COMMUNITY): Payer: Self-pay | Admitting: *Deleted

## 2017-11-19 ENCOUNTER — Encounter (HOSPITAL_COMMUNITY): Payer: Self-pay | Admitting: *Deleted

## 2017-11-19 DIAGNOSIS — Z3A40 40 weeks gestation of pregnancy: Secondary | ICD-10-CM

## 2017-11-19 DIAGNOSIS — O99824 Streptococcus B carrier state complicating childbirth: Secondary | ICD-10-CM

## 2017-11-19 LAB — RPR: RPR: NONREACTIVE

## 2017-11-19 MED ORDER — ONDANSETRON HCL 4 MG/2ML IJ SOLN: 4.0000 mg | INTRAMUSCULAR | Status: DC | PRN

## 2017-11-19 MED ORDER — IBUPROFEN 600 MG PO TABS
600.0000 mg | ORAL_TABLET | Freq: Four times a day (QID) | ORAL | Status: DC
  Administered 2017-11-19 – 2017-11-21 (×8): 600 mg via ORAL
  Filled 2017-11-19 (×8): qty 1

## 2017-11-19 MED ORDER — TETANUS-DIPHTH-ACELL PERTUSSIS 5-2.5-18.5 LF-MCG/0.5 IM SUSP: 0.5000 mL | Freq: Once | INTRAMUSCULAR | Status: DC

## 2017-11-19 MED ORDER — EPHEDRINE 5 MG/ML INJ: 10.0000 mg | INTRAVENOUS | Status: DC | PRN

## 2017-11-19 MED ORDER — PHENYLEPHRINE 40 MCG/ML (10ML) SYRINGE FOR IV PUSH (FOR BLOOD PRESSURE SUPPORT)
80.0000 ug | PREFILLED_SYRINGE | INTRAVENOUS | Status: DC | PRN
  Filled 2017-11-19: qty 10

## 2017-11-19 MED ORDER — ACETAMINOPHEN 325 MG PO TABS
650.0000 mg | ORAL_TABLET | ORAL | Status: DC | PRN
  Administered 2017-11-19 – 2017-11-21 (×2): 650 mg via ORAL
  Filled 2017-11-19 (×2): qty 2

## 2017-11-19 MED ORDER — SENNOSIDES-DOCUSATE SODIUM 8.6-50 MG PO TABS
2.0000 | ORAL_TABLET | ORAL | Status: DC
  Administered 2017-11-19 – 2017-11-21 (×2): 2 via ORAL
  Filled 2017-11-19 (×2): qty 2

## 2017-11-19 MED ORDER — FENTANYL 2.5 MCG/ML BUPIVACAINE 1/10 % EPIDURAL INFUSION (WH - ANES)
14.0000 mL/h | INTRAMUSCULAR | Status: DC | PRN
  Administered 2017-11-19 (×2): 14 mL/h via EPIDURAL
  Filled 2017-11-19 (×2): qty 100

## 2017-11-19 MED ORDER — ZOLPIDEM TARTRATE 5 MG PO TABS: 5.0000 mg | ORAL_TABLET | Freq: Every evening | ORAL | Status: DC | PRN

## 2017-11-19 MED ORDER — PRENATAL MULTIVITAMIN CH
1.0000 | ORAL_TABLET | Freq: Every day | ORAL | Status: DC
  Administered 2017-11-19 – 2017-11-20 (×2): 1 via ORAL
  Filled 2017-11-19 (×2): qty 1

## 2017-11-19 MED ORDER — COCONUT OIL OIL: 1.0000 "application " | TOPICAL_OIL | Status: DC | PRN

## 2017-11-19 MED ORDER — BENZOCAINE-MENTHOL 20-0.5 % EX AERO
1.0000 "application " | INHALATION_SPRAY | CUTANEOUS | Status: DC | PRN
  Administered 2017-11-19: 1 via TOPICAL
  Filled 2017-11-19: qty 56

## 2017-11-19 MED ORDER — PHENYLEPHRINE 40 MCG/ML (10ML) SYRINGE FOR IV PUSH (FOR BLOOD PRESSURE SUPPORT): 80.0000 ug | PREFILLED_SYRINGE | INTRAVENOUS | Status: DC | PRN

## 2017-11-19 MED ORDER — LACTATED RINGERS IV SOLN: 500.0000 mL | Freq: Once | INTRAVENOUS | Status: DC

## 2017-11-19 MED ORDER — DIPHENHYDRAMINE HCL 50 MG/ML IJ SOLN: 12.5000 mg | INTRAMUSCULAR | Status: DC | PRN

## 2017-11-19 MED ORDER — EPHEDRINE 5 MG/ML INJ
10.0000 mg | INTRAVENOUS | Status: DC | PRN
Start: 2017-11-19 — End: 2017-11-19

## 2017-11-19 MED ORDER — LIDOCAINE HCL (PF) 1 % IJ SOLN
INTRAMUSCULAR | Status: DC | PRN
  Administered 2017-11-19 (×2): 5 mL

## 2017-11-19 MED ORDER — DIPHENHYDRAMINE HCL 25 MG PO CAPS: 25.0000 mg | ORAL_CAPSULE | Freq: Four times a day (QID) | ORAL | Status: DC | PRN

## 2017-11-19 MED ORDER — ONDANSETRON HCL 4 MG PO TABS: 4.0000 mg | ORAL_TABLET | ORAL | Status: DC | PRN

## 2017-11-19 MED ORDER — SIMETHICONE 80 MG PO CHEW: 80.0000 mg | CHEWABLE_TABLET | ORAL | Status: DC | PRN

## 2017-11-19 MED ORDER — LACTATED RINGERS IV SOLN
INTRAVENOUS | Status: DC
  Administered 2017-11-19: 03:00:00 via INTRAUTERINE

## 2017-11-19 MED ORDER — WITCH HAZEL-GLYCERIN EX PADS: 1.0000 "application " | MEDICATED_PAD | CUTANEOUS | Status: DC | PRN

## 2017-11-19 MED ORDER — DIBUCAINE 1 % RE OINT: 1.0000 "application " | TOPICAL_OINTMENT | RECTAL | Status: DC | PRN

## 2017-11-19 NOTE — Anesthesia Procedure Notes (Signed)
Epidural Patient location during procedure: OB  Staffing Anesthesiologist: Kalin Amrhein, MD Performed: anesthesiologist   Preanesthetic Checklist Completed: patient identified, site marked, surgical consent, pre-op evaluation, timeout performed, IV checked, risks and benefits discussed and monitors and equipment checked  Epidural Patient position: sitting Prep: DuraPrep Patient monitoring: heart rate, continuous pulse ox and blood pressure Approach: right paramedian Location: L3-L4 Injection technique: LOR saline  Needle:  Needle type: Tuohy  Needle gauge: 17 G Needle length: 9 cm and 9 Needle insertion depth: 6 cm Catheter type: closed end flexible Catheter size: 20 Guage Catheter at skin depth: 10 cm Test dose: negative  Assessment Events: blood not aspirated, injection not painful, no injection resistance, negative IV test and no paresthesia  Additional Notes Patient identified. Risks/Benefits/Options discussed with patient including but not limited to bleeding, infection, nerve damage, paralysis, failed block, incomplete pain control, headache, blood pressure changes, nausea, vomiting, reactions to medication both or allergic, itching and postpartum back pain. Confirmed with bedside nurse the patient's most recent platelet count. Confirmed with patient that they are not currently taking any anticoagulation, have any bleeding history or any family history of bleeding disorders. Patient expressed understanding and wished to proceed. All questions were answered. Sterile technique was used throughout the entire procedure. Please see nursing notes for vital signs. Test dose was given through epidural needle and negative prior to continuing to dose epidural or start infusion. Warning signs of high block given to the patient including shortness of breath, tingling/numbness in hands, complete motor block, or any concerning symptoms with instructions to call for help. Patient was given  instructions on fall risk and not to get out of bed. All questions and concerns addressed with instructions to call with any issues.     

## 2017-11-19 NOTE — Anesthesia Preprocedure Evaluation (Signed)

## 2017-11-19 NOTE — Progress Notes (Addendum)
Vitals:   11/19/17 0136 11/19/17 0139  BP: (!) 99/56   Pulse: 78   Resp:  18  Temp:  98.3 F (36.8 C)  SpO2: 100%    Comfortable w/epidrual. cx now 4.5 cms, progressing after 1 cytotec.  FHR 150s, had some variables and lates that resolved w/IVF bolus and posiiton change.  Moderated variability..  Ctx q 2-3 minutes.  Will continue to monitor.

## 2017-11-19 NOTE — Lactation Note (Signed)
This note was copied from a baby's chart. Lactation Consultation Note  Patient Name: Hannah Sosa ZOXWR'UToday's Date: 11/19/2017 Reason for consult: Primapara;Term Breastfeeding consultation services and support information given and reviewed.  This is mom's first baby and newborn is 5 hours old.  Mom states baby has been to breast 4 times.  Instructed to watch for feeding cues and feed with any cue.  Encouraged to call for assist/concerns prn.  Maternal Data Does the patient have breastfeeding experience prior to this delivery?: No  Feeding Feeding Type: Breast Fed Length of feed: 16 min  LATCH Score Latch: Grasps breast easily, tongue down, lips flanged, rhythmical sucking.  Audible Swallowing: Spontaneous and intermittent  Type of Nipple: Everted at rest and after stimulation  Comfort (Breast/Nipple): Soft / non-tender  Hold (Positioning): Assistance needed to correctly position infant at breast and maintain latch.  LATCH Score: 9  Interventions Interventions: Breast feeding basics reviewed;Assisted with latch;Skin to skin;Expressed milk;Hand express;Breast massage  Lactation Tools Discussed/Used     Consult Status Consult Status: Follow-up Date: 11/20/17 Follow-up type: In-patient    Huston FoleyMOULDEN, Latreece Mochizuki S 11/19/2017, 2:40 PM

## 2017-11-19 NOTE — Anesthesia Postprocedure Evaluation (Signed)
Anesthesia Post Note  Patient: Hannah Sosa  Procedure(s) Performed: AN AD HOC LABOR EPIDURAL     Patient location during evaluation: Mother Baby Anesthesia Type: Epidural Level of consciousness: awake and alert, oriented and patient cooperative Pain management: pain level controlled Vital Signs Assessment: post-procedure vital signs reviewed and stable Respiratory status: spontaneous breathing Cardiovascular status: stable Postop Assessment: no headache, epidural receding, patient able to bend at knees and no signs of nausea or vomiting Anesthetic complications: no Comments: Pain score 0.    Last Vitals:  Vitals:   11/19/17 1030 11/19/17 1124  BP: 127/66 97/63  Pulse: 94 99  Resp: 16 18  Temp: (!) 38 C 37.6 C  SpO2: 97%     Last Pain:  Vitals:   11/19/17 1124  TempSrc: Axillary  PainSc: 0-No pain   Pain Goal:                 [facility] Health CenterWRINKLE,[name]

## 2017-11-19 NOTE — Progress Notes (Signed)
Had some repetitive variable w/occ late decels. Moderate variability.  6/80/-2  AROM w/light mec.  IUPC placed. Will do an amnioinfusion.

## 2017-11-19 NOTE — Progress Notes (Signed)
Vitals:   11/19/17 0238 11/19/17 0436  BP:    Pulse:    Resp: 18 18  Temp: 98.4 F (36.9 C) 98.8 F (37.1 C)  SpO2:     FHR 140's, moderate variability, occ variable decel but overall a Cat 1 strip.  MVUs 140, but cx making good change, now 8/90/-1. Pt comfortable w/epidural

## 2017-11-19 NOTE — Progress Notes (Signed)
Attempted lower extremity venous duplex @ 8:15, per RN, patient is preparing for birth, not a good time for Doppler.  Will attempt at a later time.   Levin Baconlaire Kalecia Hartney- RDMS, RVT 8:20 AM  11/19/2017

## 2017-11-20 MED ORDER — POLYSACCHARIDE IRON COMPLEX 150 MG PO CAPS
150.0000 mg | ORAL_CAPSULE | Freq: Two times a day (BID) | ORAL | Status: DC
  Administered 2017-11-20 (×2): 150 mg via ORAL
  Filled 2017-11-20 (×2): qty 1

## 2017-11-20 NOTE — Progress Notes (Signed)
Post Partum Day #1 Subjective: no complaints, up ad lib and voiding  Objective: Blood pressure 121/64, pulse 78, temperature 97.8 F (36.6 C), temperature source Oral, resp. rate 16, last menstrual period 02/09/2017, SpO2 97 %, unknown if currently breastfeeding.  Physical Exam:  General: alert, cooperative and no distress Lochia: appropriate Uterine Fundus: firm Incision: none DVT Evaluation: No evidence of DVT seen on physical exam. No cords or calf tenderness. No significant calf/ankle edema.  Recent Labs    11/18/17 1654  HGB 8.4*  HCT 30.6*    Assessment/Plan: Plan for discharge tomorrow.  Anemia: iron started.  Lactation consult ordered.    LOS: 2 days   Natasja Niday A Ermalinda Joubert 11/20/2017, 7:16 AM
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
## 2017-11-21 MED ORDER — SENNOSIDES-DOCUSATE SODIUM 8.6-50 MG PO TABS: 2.0000 | ORAL_TABLET | Freq: Every evening | ORAL | 0 refills | Status: AC | PRN

## 2017-11-21 MED ORDER — IBUPROFEN 600 MG PO TABS: 600.0000 mg | ORAL_TABLET | Freq: Four times a day (QID) | ORAL | 0 refills | Status: DC

## 2017-11-21 NOTE — Lactation Note (Signed)
This note was copied from a baby's chart. Lactation Consultation Note  Patient Name: Hannah Sosa ACZYS'AToday's Date: 11/21/2017 Reason for consult: Follow-up assessment;1st time breastfeeding  Follow up visit at 48 hours of age.  Mom is awaiting discharge today.  Mom reports she plans to do some breastfeeding at home.  LC briefly explained how supply and demand works with breast feeding.  Mom reports her breasts are full due to giving bottles last night.  Lc offered to assist with feeding at this time.  LC encouraged mom to try hand expression.  Mom 1st pinches nipple and reports only a tiny drop of milk.  LC encouraged mom to place hands further behind areola with compression to express milk.  Mom is not receptive.  LC explained to mom is may take a few minutes of hand expression on each breast to see more milk as feedings will take 15-20 minutes to express breasts well.   Mom reports she is not enrolled with WIC, but will call tomorrow.  Lc discussed need to purchase formula for several weeks and to purchase bottles prior to discharge.  Mom reports her boyfriend will pick her up today and can go get bottles first. LC encouraged mom to try Dr. Manson PasseyBrown bottles with preemie nipples or size 1 if she wants to keep trying to breastfeed.   Discussed milk transitioning to larger volume, engorgement care discussed.   Mom to soften breast as needed prior to latch.  Mom aware of o/p services as needed.  Mom denies further concerns at this time.     Maternal Data    Feeding    LATCH Score                   Interventions Interventions: Breast feeding basics reviewed;Pre-pump if needed  Lactation Tools Discussed/Used WIC Program: (mom is not enrolled with WIC and plans to call tomorrow)   Consult Status Consult Status: Complete    Hannah Sosa 11/21/2017, 9:18 AM

## 2017-11-21 NOTE — Discharge Instructions (Signed)
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
## 2017-11-22 ENCOUNTER — Inpatient Hospital Stay (HOSPITAL_COMMUNITY): Payer: Medicaid Other

## 2017-12-17 ENCOUNTER — Ambulatory Visit: Payer: Medicaid Other | Admitting: Obstetrics and Gynecology

## 2018-02-08 ENCOUNTER — Encounter (HOSPITAL_COMMUNITY): Payer: Self-pay

## 2018-03-18 ENCOUNTER — Inpatient Hospital Stay (HOSPITAL_COMMUNITY): Payer: Medicaid Other

## 2018-03-18 ENCOUNTER — Encounter (HOSPITAL_COMMUNITY): Payer: Self-pay | Admitting: *Deleted

## 2018-03-18 ENCOUNTER — Inpatient Hospital Stay (HOSPITAL_COMMUNITY)
Admission: AD | Admit: 2018-03-18 | Discharge: 2018-03-19 | Disposition: A | Discharge status: Home / Self Care | Payer: Medicaid Other | Source: Ambulatory Visit | Attending: Obstetrics and Gynecology | Admitting: Obstetrics and Gynecology

## 2018-03-18 DIAGNOSIS — Z87891 Personal history of nicotine dependence: Secondary | ICD-10-CM | POA: Diagnosis not present

## 2018-03-18 DIAGNOSIS — O209 Hemorrhage in early pregnancy, unspecified: Secondary | ICD-10-CM | POA: Insufficient documentation

## 2018-03-18 DIAGNOSIS — Z3A01 Less than 8 weeks gestation of pregnancy: Secondary | ICD-10-CM | POA: Diagnosis not present

## 2018-03-18 DIAGNOSIS — O208 Other hemorrhage in early pregnancy: Secondary | ICD-10-CM

## 2018-03-18 DIAGNOSIS — O3680X0 Pregnancy with inconclusive fetal viability, not applicable or unspecified: Secondary | ICD-10-CM

## 2018-03-18 LAB — CBC
HCT: 32.2 % — ABNORMAL LOW (ref 36.0–46.0)
Hemoglobin: 9.7 g/dL — ABNORMAL LOW (ref 12.0–15.0)
MCH: 19.5 pg — AB (ref 26.0–34.0)
MCHC: 30.1 g/dL (ref 30.0–36.0)
MCV: 64.7 fL — ABNORMAL LOW (ref 78.0–100.0)
PLATELETS: 282 10*3/uL (ref 150–400)
RBC: 4.98 MIL/uL (ref 3.87–5.11)
RDW: 18.1 % — ABNORMAL HIGH (ref 11.5–15.5)
WBC: 19.8 10*3/uL — AB (ref 4.0–10.5)

## 2018-03-18 LAB — POCT PREGNANCY, URINE: PREG TEST UR: POSITIVE — AB

## 2018-03-18 NOTE — MAU Note (Signed)
EMS Arrival Pt had a BaBy in November. Has not had a period since. Started having heavy bleeding tonight and passed several large clots.

## 2018-03-18 NOTE — MAU Provider Note (Signed)
History     CSN: [id number]  Arrival date and time: 03/18/18 2306   First Provider Initiated Contact with Patient 03/18/18 2335     Chief Complaint  Patient presents with  . Vaginal Bleeding   HPI Hannah Sosa is a 22 y.o. G3P0100 at Unknown gestational age who presents with vaginal bleeding. She states she hasn't had a period since she delivered her baby in November but about a month ago started feeling nauseous. She states over the last two hours, she has had bleeding heavier than a period with large clots. She called EMS to bring her to the hospital when the bleeding was "constantly flowing." She also reports lower abdominal cramping that she rates a 5/10 and has not tried anything for.   OB History    Gravida  3   Para  1   Term      Preterm  1   AB      Living  0     SAB      TAB      Ectopic      Multiple  0   Live Births  0           Past Medical History:  Diagnosis Date  . Chlamydia   . Medical history non-contributory     Past Surgical History:  Procedure Laterality Date  . NO PAST SURGERIES      Family History  Problem Relation Age of Onset  . Cancer Neg Hx   . Diabetes Neg Hx   . Hypertension Neg Hx     Social History   Tobacco Use  . Smoking status: Former Smoker    Last attempt to quit: 12/2016    Years since quitting: 1.2  . Smokeless tobacco: Never Used  Substance Use Topics  . Alcohol use: No  . Drug use: No    Allergies: No Known Allergies  Medications Prior to Admission  Medication Sig Dispense Refill Last Dose  . acetaminophen (TYLENOL) 500 MG tablet Take 500 mg by mouth every 6 (six) hours as needed for mild pain or headache.   11/19/2017 at Unknown time  . ferrous sulfate (FERROUSUL) 325 (65 FE) MG tablet Take 1 tablet (325 mg total) by mouth 2 (two) times daily. 60 tablet 1 11/18/2017 at Unknown time  . ibuprofen (ADVIL,MOTRIN) 600 MG tablet Take 1 tablet (600 mg total) by mouth every 6 (six) hours. 30 tablet 0   .  Prenatal MV & Min w/FA-DHA (PRENATAL ADULT GUMMY/DHA/FA PO) Take 2 each by mouth daily.   11/18/2017 at Unknown time  . senna-docusate (SENOKOT-S) 8.6-50 MG tablet Take 2 tablets by mouth at bedtime as needed for mild constipation. 30 tablet 0   . Vitamin D, Ergocalciferol, (DRISDOL) 50000 units CAPS capsule Take 1 capsule (50,000 Units total) by mouth every 7 (seven) days. 30 capsule 2 11/18/2017 at Unknown time    Review of Systems  Constitutional: Negative.  Negative for fatigue and fever.  HENT: Negative.   Respiratory: Negative.  Negative for shortness of breath.   Cardiovascular: Negative.  Negative for chest pain.  Gastrointestinal: Positive for abdominal pain. Negative for constipation, diarrhea, nausea and vomiting.  Genitourinary: Positive for vaginal bleeding. Negative for dysuria.  Neurological: Negative.  Negative for dizziness and headaches.   Physical Exam   Blood pressure 106/68, pulse 87, temperature 97.8 F (36.6 C), resp. rate 18, height 5\' 2"  (1.575 m), unknown if currently breastfeeding.  Physical Exam  Nursing note and vitals  reviewed. Constitutional: She is oriented to person, place, and time. She appears well-developed and well-nourished. No distress.  HENT:  Head: Normocephalic.  Eyes: Pupils are equal, round, and reactive to light.  Cardiovascular: Normal rate, regular rhythm and normal heart sounds.  Respiratory: Effort normal and breath sounds normal. No respiratory distress.  GI: Soft. Bowel sounds are normal. She exhibits no distension. There is no tenderness.  Genitourinary:  Genitourinary Comments: Moderate amount of bright red bleeding in vault.   Neurological: She is alert and oriented to person, place, and time.  Skin: Skin is warm and dry.  Psychiatric: She has a normal mood and affect. Her behavior is normal. Judgment and thought content normal.    MAU Course  Procedures Results for orders placed or performed during the hospital encounter of  03/18/18 (from the past 24 hour(s))  Pregnancy, urine POC     Status: Abnormal   Collection Time: 03/18/18 11:26 PM  Result Value Ref Range   Preg Test, Ur POSITIVE (A) NEGATIVE  CBC     Status: Abnormal   Collection Time: 03/18/18 11:33 PM  Result Value Ref Range   WBC 19.8 (H) 4.0 - 10.5 K/uL   RBC 4.98 3.87 - 5.11 MIL/uL   Hemoglobin 9.7 (L) 12.0 - 15.0 g/dL   HCT 29.532.2 (L) 62.136.0 - 30.846.0 %   MCV 64.7 (L) 78.0 - 100.0 fL   MCH 19.5 (L) 26.0 - 34.0 pg   MCHC 30.1 30.0 - 36.0 g/dL   RDW 65.718.1 (H) 84.611.5 - 96.215.5 %   Platelets 282 150 - 400 K/uL  hCG, quantitative, pregnancy     Status: Abnormal   Collection Time: 03/18/18 11:33 PM  Result Value Ref Range   hCG, Beta Chain, Quant, S 2,688 (H) <5 mIU/mL  ABO/Rh     Status: None   Collection Time: 03/18/18 11:33 PM  Result Value Ref Range   ABO/RH(D)      O POS Performed at [facility], [address] [place], [place] [address]    [place] Ob Less Than 14 Weeks With Ob Transvaginal  Result Date: 03/19/2018 CLINICAL DATA:  Initial evaluation for vaginal bleeding, early pregnancy. EXAM: OBSTETRIC <14 WK US AND TRANSVAGINAL OB US TECHNIQUE: Both transabdominal and transvaginal ultrasound examinations were performed for complete evaluation of the gestation as well as the maternal uterus, adnexal regions, and pelvic cul-de-sac. Transvaginal technique was performed to assess early pregnancy. COMPARISON:  None. FINDINGS: Intrauterine gestational sac: Single Yolk sac:  Not visualized. Embryo:  Not visualized. Cardiac Activity: N/A Heart Rate: N/A  bpm MSD: 4.1 mm   5 w   1 d Subchorionic hemorrhage:  None visualized. Maternal uterus/adnexae: Ovaries are normal in appearance bilaterally. No adnexal mass. Trace free physiologic fluid present within the pelvis. IMPRESSION: 1. Probable early intrauterine gestational sac, but no yolk sac, fetal pole, or cardiac activity yet visualized. Recommend follow-up quantitative B-HCG levels and follow-up US in 14 days  to assess viability. This recommendation follows SRU consensus guidelines: Diagnostic Criteria for Nonviable Pregnancy Early in the First Trimester. Malva Limes Engl J Med 2013; 132:4401-02; 369:1443-51. 2. No other acute maternal uterine or adnexal abnormality identified. Electronically Signed   By: Rise MuBenjamin  McClintock M.D.   On: 03/19/2018 00:29   MDM UA, UPT CBC, HCG, ABO/Rh US OB Transvaginal US OB Comp Less 14 weeks- gestational sac but no yolk sac or fetal pole identified  Patient able to ambulate to bathroom, bleeding minimal and vital signs stable.  Assessment and Plan  1. Pregnancy of unknown anatomic location   2. Vaginal bleeding affecting early pregnancy   3. [redacted] weeks gestation of pregnancy    -Discharge home in stable condition -Vaginal bleeding and ectopic precautions discussed -Patient advised to follow-[facility] with [facility] on Monday for repeat blood work.  -Patient may return to MAU as needed or if her condition were to change or worsen  Rolm Bookbinder CNM 03/18/2018, 11:35 PM

## 2018-03-19 LAB — HCG, QUANTITATIVE, PREGNANCY: HCG, BETA CHAIN, QUANT, S: 2688 m[IU]/mL — AB (ref <5)

## 2018-03-19 LAB — ABO/RH: ABO/RH(D): O POS

## 2018-03-19 NOTE — Discharge Instructions (Signed)
Vaginal Bleeding During Pregnancy, First Trimester °A small amount of bleeding (spotting) from the vagina is common in early pregnancy. Sometimes the bleeding is normal and is not a problem, and sometimes it is a sign of something serious. Be sure to tell your doctor about any bleeding from your vagina right away. °Follow these instructions at home: °· Watch your condition for any changes. °· Follow your doctor's instructions about how active you can be. °· If you are on bed rest: °? You may need to stay in bed and only get up to use the bathroom. °? You may be allowed to do some activities. °? If you need help, make plans for someone to help you. °· Write down: °? The number of pads you use each day. °? How often you change pads. °? How soaked (saturated) your pads are. °· Do not use tampons. °· Do not douche. °· Do not have sex or orgasms until your doctor says it is okay. °· If you pass any tissue from your vagina, save the tissue so you can show it to your doctor. °· Only take medicines as told by your doctor. °· Do not take aspirin because it can make you bleed. °· Keep all follow-up visits as told by your doctor. °Contact a doctor if: °· You bleed from your vagina. °· You have cramps. °· You have labor pains. °· You have a fever that does not go away after you take medicine. °Get help right away if: °· You have very bad cramps in your back or belly (abdomen). °· You pass large clots or tissue from your vagina. °· You bleed more. °· You feel light-headed or weak. °· You pass out (faint). °· You have chills. °· You are leaking fluid or have a gush of fluid from your vagina. °· You pass out while pooping (having a bowel movement). °This information is not intended to replace advice given to you by your health care provider. Make sure you discuss any questions you have with your health care provider. °Document Released: 04/23/2014 Document Revised: 05/14/2016 Document Reviewed: 08/14/2013 °Elsevier Interactive  Patient Education © 2018 [employer] Inc. ° °

## 2018-03-21 ENCOUNTER — Ambulatory Visit: Payer: Medicaid Other | Admitting: General Practice

## 2018-03-21 ENCOUNTER — Telehealth: Payer: Self-pay | Admitting: General Practice

## 2018-03-21 ENCOUNTER — Encounter: Payer: Self-pay | Admitting: General Practice

## 2018-03-21 DIAGNOSIS — O3680X0 Pregnancy with inconclusive fetal viability, not applicable or unspecified: Secondary | ICD-10-CM

## 2018-03-21 DIAGNOSIS — IMO0001 Reserved for inherently not codable concepts without codable children: Secondary | ICD-10-CM

## 2018-03-21 LAB — HCG, QUANTITATIVE, PREGNANCY: hCG, Beta Chain, Quant, S: 246 m[IU]/mL — ABNORMAL HIGH (ref <5)

## 2018-03-21 MED ORDER — NORGESTIMATE-ETH ESTRADIOL 0.25-35 MG-MCG PO TABS: 1.0000 | ORAL_TABLET | Freq: Every day | ORAL | 11 refills | Status: AC

## 2018-03-21 NOTE — Telephone Encounter (Signed)
Called patient & informed her of bhcg results & offered birth control to patient if she desires. Patient verbalized understanding to all & requests Rx for OCPs. Obtained from Dr Adrian BlackwaterStinson & informed patient she can begin those today. Patient verbalized understanding & had no questions

## 2018-03-21 NOTE — Progress Notes (Signed)
Chart reviewed - agree with RN documentation.   

## 2018-03-21 NOTE — Progress Notes (Signed)
Patient here for stat bhcg today. Patient reports bleeding has slowed down and is now light. Patient denies pain. Discussed with patient we are monitoring her beta hcg levels today and asked she wait in lobby for results/updated plan of care. Patient verbalized understanding and had no questions at this time.  Reviewed results with Dr Adrian BlackwaterStinson who finds decreasing bhcg levels indicate of SAB- can begin birth control for patient today if she desires. Went to lobby to review results with patient but she had left. Will call patient and document under telephone note.

## 2018-04-06 ENCOUNTER — Emergency Department (HOSPITAL_COMMUNITY): Payer: Medicaid Other

## 2018-04-06 ENCOUNTER — Encounter (HOSPITAL_COMMUNITY): Payer: Self-pay | Admitting: *Deleted

## 2018-04-06 ENCOUNTER — Emergency Department (HOSPITAL_COMMUNITY)
Admission: EM | Admit: 2018-04-06 | Discharge: 2018-04-06 | Disposition: A | Discharge status: Home / Self Care | Payer: Medicaid Other | Attending: Emergency Medicine | Admitting: Emergency Medicine

## 2018-04-06 DIAGNOSIS — M25562 Pain in left knee: Secondary | ICD-10-CM | POA: Insufficient documentation

## 2018-04-06 DIAGNOSIS — M542 Cervicalgia: Secondary | ICD-10-CM | POA: Diagnosis not present

## 2018-04-06 DIAGNOSIS — Y9389 Activity, other specified: Secondary | ICD-10-CM | POA: Insufficient documentation

## 2018-04-06 DIAGNOSIS — M25561 Pain in right knee: Secondary | ICD-10-CM | POA: Insufficient documentation

## 2018-04-06 DIAGNOSIS — Y92410 Unspecified street and highway as the place of occurrence of the external cause: Secondary | ICD-10-CM | POA: Insufficient documentation

## 2018-04-06 DIAGNOSIS — R51 Headache: Secondary | ICD-10-CM | POA: Diagnosis not present

## 2018-04-06 DIAGNOSIS — M545 Low back pain: Secondary | ICD-10-CM | POA: Insufficient documentation

## 2018-04-06 DIAGNOSIS — Y999 Unspecified external cause status: Secondary | ICD-10-CM | POA: Insufficient documentation

## 2018-04-06 DIAGNOSIS — S3992XA Unspecified injury of lower back, initial encounter: Secondary | ICD-10-CM | POA: Diagnosis present

## 2018-04-06 LAB — POC URINE PREG, ED: PREG TEST UR: NEGATIVE

## 2018-04-06 MED ORDER — ACETAMINOPHEN 500 MG PO TABS
1000.0000 mg | ORAL_TABLET | Freq: Once | ORAL | Status: AC
  Administered 2018-04-06: 1000 mg via ORAL
  Filled 2018-04-06: qty 2
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
## 2018-06-03 NOTE — Telephone Encounter (Signed)
Preadmission screen  

## 2018-07-22 ENCOUNTER — Emergency Department (HOSPITAL_COMMUNITY)
Admission: EM | Admit: 2018-07-22 | Discharge: 2018-07-22 | Disposition: A | Discharge status: Home / Self Care | Payer: Medicaid Other | Attending: Emergency Medicine | Admitting: Emergency Medicine

## 2018-07-22 ENCOUNTER — Encounter (HOSPITAL_COMMUNITY): Payer: Self-pay

## 2018-07-22 ENCOUNTER — Emergency Department (HOSPITAL_COMMUNITY): Payer: Medicaid Other

## 2018-07-22 DIAGNOSIS — Y999 Unspecified external cause status: Secondary | ICD-10-CM | POA: Insufficient documentation

## 2018-07-22 DIAGNOSIS — R001 Bradycardia, unspecified: Secondary | ICD-10-CM | POA: Insufficient documentation

## 2018-07-22 DIAGNOSIS — Z23 Encounter for immunization: Secondary | ICD-10-CM | POA: Diagnosis not present

## 2018-07-22 DIAGNOSIS — R410 Disorientation, unspecified: Secondary | ICD-10-CM | POA: Diagnosis not present

## 2018-07-22 DIAGNOSIS — Y92410 Unspecified street and highway as the place of occurrence of the external cause: Secondary | ICD-10-CM | POA: Diagnosis not present

## 2018-07-22 DIAGNOSIS — S0101XA Laceration without foreign body of scalp, initial encounter: Secondary | ICD-10-CM | POA: Insufficient documentation

## 2018-07-22 DIAGNOSIS — R29898 Other symptoms and signs involving the musculoskeletal system: Secondary | ICD-10-CM | POA: Diagnosis not present

## 2018-07-22 DIAGNOSIS — S060X1A Concussion with loss of consciousness of 30 minutes or less, initial encounter: Secondary | ICD-10-CM

## 2018-07-22 DIAGNOSIS — Y9389 Activity, other specified: Secondary | ICD-10-CM | POA: Insufficient documentation

## 2018-07-22 DIAGNOSIS — R0600 Dyspnea, unspecified: Secondary | ICD-10-CM | POA: Diagnosis not present

## 2018-07-22 DIAGNOSIS — S098XXA Other specified injuries of head, initial encounter: Secondary | ICD-10-CM | POA: Diagnosis present

## 2018-07-22 DIAGNOSIS — M542 Cervicalgia: Secondary | ICD-10-CM | POA: Insufficient documentation

## 2018-07-22 DIAGNOSIS — R200 Anesthesia of skin: Secondary | ICD-10-CM | POA: Diagnosis not present

## 2018-07-22 DIAGNOSIS — Z79899 Other long term (current) drug therapy: Secondary | ICD-10-CM | POA: Diagnosis not present

## 2018-07-22 DIAGNOSIS — R202 Paresthesia of skin: Secondary | ICD-10-CM

## 2018-07-22 LAB — URINALYSIS, ROUTINE W REFLEX MICROSCOPIC
Bilirubin Urine: NEGATIVE
Glucose, UA: NEGATIVE mg/dL
Ketones, ur: NEGATIVE mg/dL
Nitrite: NEGATIVE
PROTEIN: NEGATIVE mg/dL
Specific Gravity, Urine: 1.045 — ABNORMAL HIGH (ref 1.005–1.030)
pH: 5 (ref 5.0–8.0)

## 2018-07-22 LAB — COMPREHENSIVE METABOLIC PANEL
ALBUMIN: 3.9 g/dL (ref 3.5–5.0)
ALT: 15 U/L (ref 0–44)
AST: 19 U/L (ref 15–41)
Alkaline Phosphatase: 66 U/L (ref 38–126)
Anion gap: 9 (ref 5–15)
BILIRUBIN TOTAL: 0.4 mg/dL (ref 0.3–1.2)
BUN: 12 mg/dL (ref 6–20)
CO2: 21 mmol/L — ABNORMAL LOW (ref 22–32)
Calcium: 8.8 mg/dL — ABNORMAL LOW (ref 8.9–10.3)
Chloride: 109 mmol/L (ref 98–111)
Creatinine, Ser: 0.66 mg/dL (ref 0.44–1.00)
GFR calc Af Amer: >60 mL/min (ref >60)
GFR calc non Af Amer: >60 mL/min (ref >60)
Glucose, Bld: 104 mg/dL — ABNORMAL HIGH (ref 70–99)
POTASSIUM: 4.4 mmol/L (ref 3.5–5.1)
Sodium: 139 mmol/L (ref 135–145)
TOTAL PROTEIN: 7 g/dL (ref 6.5–8.1)

## 2018-07-22 LAB — CBC
HCT: 36 % (ref 36.0–46.0)
Hemoglobin: 9.5 g/dL — ABNORMAL LOW (ref 12.0–15.0)
MCH: 16.4 pg — ABNORMAL LOW (ref 26.0–34.0)
MCHC: 26.4 g/dL — ABNORMAL LOW (ref 30.0–36.0)
MCV: 62.1 fL — ABNORMAL LOW (ref 78.0–100.0)
Platelets: 331 10*3/uL (ref 150–400)
RBC: 5.8 MIL/uL — ABNORMAL HIGH (ref 3.87–5.11)
RDW: 22.7 % — ABNORMAL HIGH (ref 11.5–15.5)
WBC: 10 10*3/uL (ref 4.0–10.5)

## 2018-07-22 LAB — SAMPLE TO BLOOD BANK

## 2018-07-22 LAB — ETHANOL: Alcohol, Ethyl (B): <10 mg/dL (ref <10)

## 2018-07-22 LAB — I-STAT BETA HCG BLOOD, ED (MC, WL, AP ONLY): I-stat hCG, quantitative: <5 m[IU]/mL (ref <5)

## 2018-07-22 LAB — I-STAT CHEM 8, ED
BUN: 16 mg/dL (ref 6–20)
Calcium, Ion: 1.16 mmol/L (ref 1.15–1.40)
Chloride: 107 mmol/L (ref 98–111)
Creatinine, Ser: 0.5 mg/dL (ref 0.44–1.00)
Glucose, Bld: 101 mg/dL — ABNORMAL HIGH (ref 70–99)
HEMATOCRIT: 34 % — AB (ref 36.0–46.0)
Hemoglobin: 11.6 g/dL — ABNORMAL LOW (ref 12.0–15.0)
Potassium: 5.1 mmol/L (ref 3.5–5.1)
SODIUM: 139 mmol/L (ref 135–145)
TCO2: 27 mmol/L (ref 22–32)

## 2018-07-22 LAB — PROTIME-INR
INR: 0.99
Prothrombin Time: 13 seconds (ref 11.4–15.2)

## 2018-07-22 LAB — I-STAT CG4 LACTIC ACID, ED: LACTIC ACID, VENOUS: 2.34 mmol/L — AB (ref 0.5–1.9)

## 2018-07-22 LAB — CDS SEROLOGY

## 2018-07-22 MED ORDER — IOHEXOL 300 MG/ML  SOLN
100.0000 mL | Freq: Once | INTRAMUSCULAR | Status: AC | PRN
  Administered 2018-07-22: 100 mL via INTRAVENOUS

## 2018-07-22 MED ORDER — IBUPROFEN 800 MG PO TABS: 800.0000 mg | ORAL_TABLET | Freq: Three times a day (TID) | ORAL | 0 refills | Status: AC

## 2018-07-22 MED ORDER — METHOCARBAMOL 750 MG PO TABS: 750.0000 mg | ORAL_TABLET | Freq: Four times a day (QID) | ORAL | 0 refills | Status: AC | PRN

## 2018-07-22 MED ORDER — KETOROLAC TROMETHAMINE 15 MG/ML IJ SOLN
15.0000 mg | Freq: Once | INTRAMUSCULAR | Status: AC
  Administered 2018-07-22: 15 mg via INTRAVENOUS
  Filled 2018-07-22: qty 1

## 2018-07-22 MED ORDER — TETANUS-DIPHTH-ACELL PERTUSSIS 5-2.5-18.5 LF-MCG/0.5 IM SUSP
0.5000 mL | Freq: Once | INTRAMUSCULAR | Status: AC
  Administered 2018-07-22: 0.5 mL via INTRAMUSCULAR
  Filled 2018-07-22: qty 0.5

## 2018-07-22 NOTE — ED Provider Notes (Signed)
..  Laceration Repair Date/Time: 07/22/2018 1:52 PM Performed by: Maxwell CaulLayden, Lindsey A, PA-C Authorized by: Maxwell CaulLayden, Lindsey A, PA-C   Consent:    Consent obtained:  Verbal   Consent given by:  Patient   Risks discussed:  Infection Anesthesia (see MAR for exact dosages):    Anesthesia method:  None Laceration details:    Location:  Scalp   Scalp location:  Frontal   Length (cm):  1 Repair type:    Repair type:  Simple Exploration:    Wound extent: no foreign bodies/material noted   Treatment:    Wound cleansed with: alcohol.   Amount of cleaning:  Extensive   Irrigation solution:  Sterile saline Skin repair:    Repair method:  Staples   Number of staples:  1 Approximation:    Approximation:  Close Post-procedure details:    Dressing:  Open (no dressing)      Maxwell CaulLayden, Lindsey A, PA-C 07/22/18 1507    Tilden Fossaees, Elizabeth, MD 07/23/18 [phone]
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
## 2018-07-22 NOTE — ED Notes (Signed)
Pt ambulated well able to move all extremities no complaints noted at this time

## 2018-07-22 NOTE — ED Notes (Signed)
Patient Alert and oriented to baseline. Stable and ambulatory to baseline. Patient verbalized understanding of the discharge instructions.  Patient belongings were taken by the patient.   

## 2018-07-25 LAB — URINE CULTURE: Culture: >=100000 — AB

## 2018-07-26 ENCOUNTER — Telehealth: Payer: Self-pay | Admitting: *Deleted

## 2018-07-26 NOTE — Telephone Encounter (Signed)
Post ED Visit - Positive Culture Follow-up  Culture report reviewed by antimicrobial stewardship pharmacist:  []  Enzo BiNathan Batchelder, Pharm.D. []  Celedonio MiyamotoJeremy Frens, Pharm.D., BCPS AQ-ID []  Garvin FilaMike Maccia, Pharm.D., BCPS []  Georgina PillionElizabeth Martin, [address] BCPS []  [place]Minh Pham, [address]Pharm[address]D., BCPS, AAHIVP []  Estella HuskMichelle Turner, Pharm.D., BCPS, AAHIVP []  Lysle Pearlachel Rumbarger, PharmD, BCPS []  Phillips Climeshuy Dang, PharmD, BCPS []  Agapito GamesAlison Masters, PharmD, BCPS []  Verlan FriendsErin Deja, PharmD Reviewed by Arvilla MarketMelissa Lore, PharmD  Positive urine culture No further patient follow-up is required at this time.  Virl AxeRobertson, Jonnell H[facility]ges [facility] 07/26/2018, 11:15 AM

## 2018-07-27 ENCOUNTER — Encounter (HOSPITAL_COMMUNITY): Payer: Self-pay | Admitting: *Deleted
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
## 2022-05-07 ENCOUNTER — Ambulatory Visit (INDEPENDENT_AMBULATORY_CARE_PROVIDER_SITE_OTHER): Payer: Medicaid Other | Admitting: Primary Care

## 2022-06-16 ENCOUNTER — Ambulatory Visit (INDEPENDENT_AMBULATORY_CARE_PROVIDER_SITE_OTHER): Payer: Medicaid Other | Admitting: Primary Care

## 2024-01-02 ENCOUNTER — Other Ambulatory Visit: Payer: Self-pay

## 2024-01-02 ENCOUNTER — Encounter (HOSPITAL_BASED_OUTPATIENT_CLINIC_OR_DEPARTMENT_OTHER): Payer: Self-pay | Admitting: Emergency Medicine

## 2024-01-02 ENCOUNTER — Emergency Department (HOSPITAL_BASED_OUTPATIENT_CLINIC_OR_DEPARTMENT_OTHER)
Admission: EM | Admit: 2024-01-02 | Discharge: 2024-01-02 | Disposition: A | Discharge status: Home / Self Care | Payer: Self-pay | Attending: Emergency Medicine | Admitting: Emergency Medicine

## 2024-01-02 DIAGNOSIS — R079 Chest pain, unspecified: Secondary | ICD-10-CM | POA: Insufficient documentation

## 2024-01-02 DIAGNOSIS — Z20822 Contact with and (suspected) exposure to covid-19: Secondary | ICD-10-CM | POA: Insufficient documentation

## 2024-01-02 DIAGNOSIS — R112 Nausea with vomiting, unspecified: Secondary | ICD-10-CM | POA: Insufficient documentation

## 2024-01-02 DIAGNOSIS — D72829 Elevated white blood cell count, unspecified: Secondary | ICD-10-CM | POA: Insufficient documentation

## 2024-01-02 LAB — CBC
HCT: 48 % — ABNORMAL HIGH (ref 36.0–46.0)
Hemoglobin: 15.1 g/dL — ABNORMAL HIGH (ref 12.0–15.0)
MCH: 24.7 pg — ABNORMAL LOW (ref 26.0–34.0)
MCHC: 31.5 g/dL (ref 30.0–36.0)
MCV: 78.6 fL — ABNORMAL LOW (ref 80.0–100.0)
Platelets: 304 10*3/uL (ref 150–400)
RBC: 6.11 MIL/uL — ABNORMAL HIGH (ref 3.87–5.11)
RDW: 14.6 % (ref 11.5–15.5)
WBC: 11.1 10*3/uL — ABNORMAL HIGH (ref 4.0–10.5)
nRBC: 0 % (ref 0.0–0.2)

## 2024-01-02 LAB — COMPREHENSIVE METABOLIC PANEL
ALT: 35 U/L (ref 0–44)
AST: 21 U/L (ref 15–41)
Albumin: 4.9 g/dL (ref 3.5–5.0)
Alkaline Phosphatase: 58 U/L (ref 38–126)
Anion gap: 9 (ref 5–15)
BUN: 9 mg/dL (ref 6–20)
CO2: 27 mmol/L (ref 22–32)
Calcium: 10 mg/dL (ref 8.9–10.3)
Chloride: 102 mmol/L (ref 98–111)
Creatinine, Ser: 0.64 mg/dL (ref 0.44–1.00)
GFR, Estimated: >60 mL/min (ref >60)
Glucose, Bld: 74 mg/dL (ref 70–99)
Potassium: 3.8 mmol/L (ref 3.5–5.1)
Sodium: 138 mmol/L (ref 135–145)
Total Bilirubin: 0.5 mg/dL (ref 0.0–1.2)
Total Protein: 8 g/dL (ref 6.5–8.1)

## 2024-01-02 LAB — URINALYSIS, ROUTINE W REFLEX MICROSCOPIC
Bilirubin Urine: NEGATIVE
Glucose, UA: NEGATIVE mg/dL
Hgb urine dipstick: NEGATIVE
Ketones, ur: NEGATIVE mg/dL
Leukocytes,Ua: NEGATIVE
Nitrite: NEGATIVE
Protein, ur: NEGATIVE mg/dL
Specific Gravity, Urine: 1.015 (ref 1.005–1.030)
pH: 7 (ref 5.0–8.0)

## 2024-01-02 LAB — RESP PANEL BY RT-PCR (RSV, FLU A&B, COVID)  RVPGX2
Influenza A by PCR: NEGATIVE
Influenza B by PCR: NEGATIVE
Resp Syncytial Virus by PCR: NEGATIVE
SARS Coronavirus 2 by RT PCR: NEGATIVE

## 2024-01-02 LAB — PREGNANCY, URINE: Preg Test, Ur: NEGATIVE

## 2024-01-02 LAB — LIPASE, BLOOD: Lipase: 16 U/L (ref 11–51)

## 2024-01-02 MED ORDER — ONDANSETRON 4 MG PO TBDP
4.0000 mg | ORAL_TABLET | Freq: Once | ORAL | Status: AC
  Administered 2024-01-02: 4 mg via ORAL
  Filled 2024-01-02: qty 1

## 2024-01-02 MED ORDER — LIDOCAINE VISCOUS HCL 2 % MT SOLN
15.0000 mL | Freq: Once | OROMUCOSAL | Status: AC
  Administered 2024-01-02: 15 mL via ORAL
  Filled 2024-01-02: qty 15

## 2024-01-02 MED ORDER — ONDANSETRON 4 MG PO TBDP: 4.0000 mg | ORAL_TABLET | Freq: Three times a day (TID) | ORAL | 0 refills | Status: AC | PRN

## 2024-01-02 MED ORDER — ALUM & MAG HYDROXIDE-SIMETH 200-200-20 MG/5ML PO SUSP
30.0000 mL | Freq: Once | ORAL | Status: AC
  Administered 2024-01-02: 30 mL via ORAL
  Filled 2024-01-02: qty 30

## 2024-01-02 NOTE — ED Notes (Signed)
Discharge instructions, follow up care, and prescription reviewed and explained, pt verbalized understanding and had no further questions on d/c.  

## 2024-01-02 NOTE — ED Triage Notes (Signed)
 Pt arrived POV, caox4, ambulatory c/o N/V x2 days, also c/o CP radiating to back, and states she has noticed some bright red blood when she vomits.

## 2024-01-02 NOTE — ED Provider Notes (Signed)
 [place] EMERGENCY DEPARTMENT AT [facility] Provider Note   CSN: [id number] Arrival date & time: 01/02/24  1205     History  Chief Complaint  Patient presents with   Emesis    Hannah Sosa is a 28 y.o. female with no significant past medical history who presents with concern for an episode of vomiting that occurred once each day for the past 2 days.  No nausea or vomiting today.  Reports she noticed a little bit of red blood in her episode of emesis yesterday.  Otherwise, emesis has been yellow appearing.  States she has some feelings of acid reflux in her esophagus after the vomiting occurs.  Denies any chest pain or shortness of breath.  She denies any abdominal pain, changes in bowel bladder habits.  Denies any recent travel or abnormal food intake. No history of liver disease or esophageal varices.    Emesis      Home Medications Prior to Admission medications   Medication Sig Start Date End Date Taking? Authorizing Provider  ondansetron  (ZOFRAN -ODT) 4 MG disintegrating tablet Take 1 tablet (4 mg total) by mouth every 8 (eight) hours as needed for nausea or vomiting. 01/02/24  Yes Veta Palma, PA-C  acetaminophen  (TYLENOL ) 500 MG tablet Take 500 mg by mouth every 6 (six) hours as needed for mild pain or headache.    [provider]  ferrous sulfate  (FERROUSUL) 325 (65 FE) MG tablet Take 1 tablet (325 mg total) by mouth 2 (two) times daily. 09/30/17   Ervin, Michael L, MD  ibuprofen  (ADVIL ,MOTRIN ) 800 MG tablet Take 1 tablet (800 mg total) by mouth 3 (three) times daily. 07/22/18   Griselda Norris, MD  methocarbamol  (ROBAXIN ) 750 MG tablet Take 1 tablet (750 mg total) by mouth every 6 (six) hours as needed for muscle spasms. 07/22/18   Griselda Norris, MD  norgestimate -ethinyl estradiol  (ORTHO-CYCLEN,SPRINTEC,PREVIFEM) 0.25-35 MG-MCG tablet Take 1 tablet by mouth daily. 03/21/18   Stinson, Jacob J, DO  Prenatal MV & Min w/FA-DHA (PRENATAL ADULT GUMMY/DHA/FA  PO) Take 2 each by mouth daily.    [provider]  senna-docusate (SENOKOT-S) 8.6-50 MG tablet Take 2 tablets by mouth at bedtime as needed for mild constipation. 11/21/17   Phelps, Jazma Y, DO  Vitamin D , Ergocalciferol , (DRISDOL ) 50000 units CAPS capsule Take 1 capsule (50,000 Units total) by mouth every 7 (seven) days. 07/27/17   Marguerite Willma LABOR, CNM      Allergies    Patient has no known allergies.    Review of Systems   Review of Systems  Gastrointestinal:  Positive for vomiting.    Physical Exam Updated Vital Signs BP 124/83 (BP Location: Right Arm)   Pulse (!) 58   Temp 97.6 F (36.4 C)   Resp 18   Ht 5' 2 (1.575 m)   Wt 90.7 kg   LMP 12/07/2023 (Approximate)   SpO2 100%   BMI 36.58 kg/m  Physical Exam Vitals and nursing note reviewed.  Constitutional:      General: She is not in acute distress.    Appearance: She is well-developed.     Comments: Well appearing, no active emesis  HENT:     Head: Normocephalic and atraumatic.  Eyes:     Conjunctiva/sclera: Conjunctivae normal.  Cardiovascular:     Rate and Rhythm: Normal rate and regular rhythm.     Heart sounds: No murmur heard. Pulmonary:     Effort: Pulmonary effort is normal. No respiratory distress.  Breath sounds: Normal breath sounds.  Abdominal:     Palpations: Abdomen is soft.     Tenderness: There is no abdominal tenderness.  Musculoskeletal:        General: No swelling.     Cervical back: Neck supple.  Skin:    General: Skin is warm and dry.     Capillary Refill: Capillary refill takes less than 2 seconds.  Neurological:     Mental Status: She is alert.  Psychiatric:        Mood and Affect: Mood normal.     ED Results / Procedures / Treatments   Labs (all labs ordered are listed, but only abnormal results are displayed) Labs Reviewed  CBC - Abnormal; Notable for the following components:      Result Value   WBC 11.1 (*)    RBC 6.11 (*)    Hemoglobin 15.1 (*)    HCT 48.0  (*)    MCV 78.6 (*)    MCH 24.7 (*)    All other components within normal limits  RESP PANEL BY RT-PCR (RSV, FLU A&B, COVID)  RVPGX2  LIPASE, BLOOD  COMPREHENSIVE METABOLIC PANEL  URINALYSIS, ROUTINE W REFLEX MICROSCOPIC  PREGNANCY, URINE    EKG EKG Interpretation Date/Time:  Sunday January 02 2024 13:18:22 EST Ventricular Rate:  70 PR Interval:  160 QRS Duration:  86 QT Interval:  420 QTC Calculation: 453 R Axis:   74  Text Interpretation: Normal sinus rhythm Normal ECG No previous ECGs available Confirmed by Cottie Cough [phone]) on 01/02/2024 2:57:55 PM  Radiology No results found.  Procedures Procedures    Medications Ordered in ED Medications  alum & mag hydroxide-simeth (MAALOX/MYLANTA) 200-200-20 MG/5ML suspension 30 mL (30 mLs Oral Given 01/02/24 1557)    And  lidocaine  (XYLOCAINE ) 2 % viscous mouth solution 15 mL (15 mLs Oral Given 01/02/24 1557)  ondansetron  (ZOFRAN -ODT) disintegrating tablet 4 mg (4 mg Oral Given 01/02/24 1555)    ED Course/ Medical Decision Making/ A&P                                 Medical Decision Making Amount and/or Complexity of Data Reviewed Labs: ordered.  Risk OTC drugs. Prescription drug management.     Differential diagnosis includes but is not limited to esophaheal varices Cholelithiasis, cholangitis, choledocholithiasis, peptic ulcer, gastritis, gastroenteritis, appendicitis, IBS, IBD, DKA, nephrolithiasis, UTI, pyelonephritis, pancreatitis, diverticulitis, mesenteric ischemia, abdominal aortic aneurysm, small bowel obstruction, volvulus, testicular torsion, ovarian torsion, and females of childbearing age pregnancy   ED Course:  Patient well-appearing, no acute distress.  Vital signs stable.  No active emesis upon my evaluation.  States she has had no further episodes of nausea or vomiting today.  Has been able to keep food down without difficulty. I Ordered, and personally interpreted labs.  The pertinent results  include:   CBC with slight leukocytosis at 11.1 CMP within normal limits, no electrolyte abnormalities  lipase within normal limits Urinalysis without any signs of infection Urine pregnancy negative COVID, flu, RSV negative Nursing note states she has been having chest pain, but upon talking to patient, she states this feels like her acid reflux after having an episode of vomiting. Denies any chest pain or shortness of breath. EKG with normal sinus rhythm, no ST changes. No concern for cardiac etiology at this time, no indication for further cardiac workup. Suspect possible viral gastroenteritis as source of her nausea and vomiting the  last 2 days. Reassuring that symptoms resolved on their own today. Suspect possible mallory weiss tear as source of the blood she noticed in her emesis yesterday. No concern for esophageal varices at this time.  She is not dehydrated on exam, urine does not show any signs of dehydration, no indication for fluids currently.  Electrolytes normal. Patient given Maalox for heartburn discomfort and zofran  for nausea. Stable and appropriate for discharge home  Impression: Nausea and vomiting  Disposition:  The patient was discharged home with instructions to take Zofran  as needed for nausea.  Follow-up with CBC in the next 3 days if symptoms not improving Return precautions given.   Cardiac Monitoring: / EKG: The patient was maintained on a cardiac monitor.  I personally viewed and interpreted the cardiac monitored which showed an underlying rhythm of: Normal sinus rhythm                Final Clinical Impression(s) / ED Diagnoses Final diagnoses:  Nausea and vomiting, unspecified vomiting type    Rx / DC Orders ED Discharge Orders          Ordered    ondansetron  (ZOFRAN -ODT) 4 MG disintegrating tablet  Every 8 hours PRN        01/02/24 1610              Veta Palma, PA-C 01/02/24 1615    Zackowski, Scott, MD 01/05/24 [phone]

## 2024-01-02 NOTE — Discharge Instructions (Addendum)
 Your COVID, flu, RSV is negative today.  Your urine does not show any signs of infection.  Your urine pregnancy is negative.   We checked your blood counts which show a slight elevation in your white blood cell count, this can indicate inflammation or infection in the body.  Your kidney, liver, and pancreas labs were normal. Your electrolytes were normal.  You have been prescribed Zofran  to use as needed for nausea.  Please take this as prescribed.  Please keep well-hydrated at home with water and electrolyte solutions such as Pedialyte.  Return the ER for any nausea or vomiting, dizziness, persistent vomiting of blood, any other new or concerning symptoms.

## 2024-09-24 ENCOUNTER — Emergency Department (HOSPITAL_COMMUNITY): Payer: Self-pay

## 2024-09-24 ENCOUNTER — Encounter (HOSPITAL_COMMUNITY): Payer: Self-pay | Admitting: Emergency Medicine

## 2024-09-24 ENCOUNTER — Emergency Department (HOSPITAL_COMMUNITY)
Admission: EM | Admit: 2024-09-24 | Discharge: 2024-09-24 | Disposition: A | Discharge status: Home / Self Care | Payer: Self-pay | Attending: Emergency Medicine | Admitting: Emergency Medicine

## 2024-09-24 DIAGNOSIS — Y92410 Unspecified street and highway as the place of occurrence of the external cause: Secondary | ICD-10-CM | POA: Insufficient documentation

## 2024-09-24 DIAGNOSIS — S6992XA Unspecified injury of left wrist, hand and finger(s), initial encounter: Secondary | ICD-10-CM | POA: Diagnosis present
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
## 2024-09-24 NOTE — Discharge Instructions (Signed)
 It was a pleasure taking care of you today.  As discussed, your x-ray did not show any broken bones.  You may take over-the-counter ibuprofen  or Tylenol  as needed for pain.  Continue to use your splint as needed for comfort.  Please follow-up with PCP if symptoms do not improve over the next few days.  Return to the ER for any worsening symptoms.
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
